# Patient Record
Sex: Female | Born: 1975 | Race: White | Hispanic: No | Marital: Married | Smoking: Never smoker
Health system: Southern US, Community
[De-identification: ages and names within clinical notes are randomized; demographics above are authoritative.]

## PROBLEM LIST (undated history)

## (undated) ENCOUNTER — Emergency Department: Admission: EM | Discharge status: Absent without leave | Payer: Managed Care, Other (non HMO)

## (undated) DIAGNOSIS — L509 Urticaria, unspecified: Secondary | ICD-10-CM

## (undated) DIAGNOSIS — I10 Essential (primary) hypertension: Secondary | ICD-10-CM

## (undated) DIAGNOSIS — F329 Major depressive disorder, single episode, unspecified: Secondary | ICD-10-CM

## (undated) DIAGNOSIS — F32A Depression, unspecified: Secondary | ICD-10-CM

## (undated) DIAGNOSIS — G43909 Migraine, unspecified, not intractable, without status migrainosus: Secondary | ICD-10-CM

## (undated) DIAGNOSIS — E039 Hypothyroidism, unspecified: Secondary | ICD-10-CM

## (undated) DIAGNOSIS — Z1371 Encounter for nonprocreative screening for genetic disease carrier status: Secondary | ICD-10-CM

## (undated) HISTORY — DX: Migraine, unspecified, not intractable, without status migrainosus: G43.909

## (undated) HISTORY — DX: Essential (primary) hypertension: I10

## (undated) HISTORY — DX: Hypothyroidism, unspecified: E03.9

## (undated) HISTORY — DX: Depression, unspecified: F32.A

## (undated) HISTORY — DX: Urticaria, unspecified: L50.9

## (undated) HISTORY — DX: Major depressive disorder, single episode, unspecified: F32.9

---
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
## 2000-05-04 HISTORY — PX: MASTOIDECTOMY: SHX711

## 2004-11-05 DIAGNOSIS — L739 Follicular disorder, unspecified: Secondary | ICD-10-CM | POA: Insufficient documentation

## 2006-09-01 ENCOUNTER — Ambulatory Visit: Payer: Self-pay | Admitting: Oncology

## 2007-09-24 ENCOUNTER — Inpatient Hospital Stay (HOSPITAL_COMMUNITY): Admission: AD | Admit: 2007-09-24 | Discharge: 2007-09-24 | Payer: Self-pay | Admitting: Obstetrics and Gynecology

## 2007-12-13 ENCOUNTER — Inpatient Hospital Stay (HOSPITAL_COMMUNITY): Admission: AD | Admit: 2007-12-13 | Discharge: 2007-12-16 | Payer: Self-pay | Admitting: Obstetrics and Gynecology

## 2007-12-13 ENCOUNTER — Inpatient Hospital Stay (HOSPITAL_COMMUNITY): Admission: AD | Admit: 2007-12-13 | Discharge: 2007-12-13 | Payer: Self-pay | Admitting: Obstetrics and Gynecology

## 2009-07-04 ENCOUNTER — Inpatient Hospital Stay (HOSPITAL_COMMUNITY): Admission: AD | Admit: 2009-07-04 | Discharge: 2009-07-06 | Payer: Self-pay | Admitting: Obstetrics and Gynecology

## 2010-07-27 LAB — CBC
HCT: 29.1 % — ABNORMAL LOW (ref 36.0–46.0)
MCHC: 34 g/dL (ref 30.0–36.0)
MCV: 93.9 fL (ref 78.0–100.0)
Platelets: 121 10*3/uL — ABNORMAL LOW (ref 150–400)
Platelets: 122 10*3/uL — ABNORMAL LOW (ref 150–400)
RBC: 3.98 MIL/uL (ref 3.87–5.11)
WBC: 10.8 10*3/uL — ABNORMAL HIGH (ref 4.0–10.5)
WBC: 12.5 10*3/uL — ABNORMAL HIGH (ref 4.0–10.5)

## 2010-07-27 LAB — RPR: RPR Ser Ql: NONREACTIVE

## 2010-07-27 LAB — COMPREHENSIVE METABOLIC PANEL
ALT: 10 U/L (ref 0–35)
AST: 16 U/L (ref 0–37)
Albumin: 2.9 g/dL — ABNORMAL LOW (ref 3.5–5.2)
CO2: 22 mEq/L (ref 19–32)
Chloride: 106 mEq/L (ref 96–112)
GFR calc Af Amer: >60 mL/min (ref >60)
GFR calc non Af Amer: >60 mL/min (ref >60)
Potassium: 3.7 mEq/L (ref 3.5–5.1)
Sodium: 135 mEq/L (ref 135–145)
Total Bilirubin: 0.8 mg/dL (ref 0.3–1.2)

## 2011-01-30 LAB — COMPREHENSIVE METABOLIC PANEL
ALT: 15
Albumin: 2.9 — ABNORMAL LOW
Albumin: 2.9 — ABNORMAL LOW
Alkaline Phosphatase: 254 — ABNORMAL HIGH
BUN: 3 — ABNORMAL LOW
Calcium: 8.8
Calcium: 8.9
GFR calc Af Amer: >60
Glucose, Bld: 85
Potassium: 3.4 — ABNORMAL LOW
Potassium: 4.1
Sodium: 136
Total Protein: 6.2
Total Protein: 6.4

## 2011-01-30 LAB — CBC
HCT: 31.1 — ABNORMAL LOW
HCT: 38.7
HCT: 39.4
HCT: 40.2
Hemoglobin: 10.4 — ABNORMAL LOW
Hemoglobin: 10.6 — ABNORMAL LOW
Hemoglobin: 13.3
Hemoglobin: 13.3
Hemoglobin: 13.4
MCHC: 33.5
MCHC: 33.9
MCHC: 34.1
MCHC: 34.2
MCV: 93.1
MCV: 94.1
Platelets: 125 — ABNORMAL LOW
RBC: 3.37 — ABNORMAL LOW
RBC: 4.24
RBC: 4.32
RDW: 13.2
RDW: 13.3
RDW: 13.3
WBC: 11.1 — ABNORMAL HIGH
WBC: 11.2 — ABNORMAL HIGH

## 2011-01-30 LAB — URIC ACID
Uric Acid, Serum: 5
Uric Acid, Serum: 5.2

## 2011-01-30 LAB — LACTATE DEHYDROGENASE: LDH: 120

## 2011-02-11 ENCOUNTER — Ambulatory Visit (INDEPENDENT_AMBULATORY_CARE_PROVIDER_SITE_OTHER): Payer: Self-pay | Admitting: Surgery

## 2011-02-16 ENCOUNTER — Ambulatory Visit (INDEPENDENT_AMBULATORY_CARE_PROVIDER_SITE_OTHER): Payer: BC Managed Care – PPO | Admitting: Surgery

## 2011-02-16 ENCOUNTER — Encounter (INDEPENDENT_AMBULATORY_CARE_PROVIDER_SITE_OTHER): Payer: Self-pay | Admitting: Surgery

## 2011-02-16 VITALS — BP 122/88 | HR 64 | Temp 99.2°F | Resp 20 | Ht 66.0 in | Wt 149.1 lb

## 2011-02-16 DIAGNOSIS — K5909 Other constipation: Secondary | ICD-10-CM

## 2011-02-16 DIAGNOSIS — K602 Anal fissure, unspecified: Secondary | ICD-10-CM

## 2011-02-16 DIAGNOSIS — K5900 Constipation, unspecified: Secondary | ICD-10-CM

## 2011-02-16 MED ORDER — AMBULATORY NON FORMULARY MEDICATION: 1.0000 "application " | Freq: Three times a day (TID) | Status: DC

## 2011-02-16 NOTE — Progress Notes (Signed)
Subjective:     Patient ID: Krista Baker, female   DOB: 07/11/1975, 35 y.o.   MRN: [id number]  HPI  Patient Care Team: Juluis Mire as Consulting Physician (Obstetrics and Gynecology)  This patient is a 35 y.o.female who presents today for surgical evaluation.   Reason for visit: Anal pain and bleeding. Probable hemorrhoids.  Patient has had with anal pain and bleeding since she had her pregnancies. Her children are now  age 55 years & 69 months old.  She notes that she will get a sharp anal pain with bowel movements. Usually not so much burning or irritation. She will note some blood with a bowel movement into the toilet.  No clots.   She has been using topical agents intermittently for the past 2 years. She does not think they have helped much. She occasionally will use Tucks pads.  She claims she usually has a bowel movement a day. However in however it is often hard and firm. No bad bouts of diarrhea. Worse constipation when she was pregnant. No history of hemorrhoidal flares. No prior interventions. No history of fistulae. She has a sister with Crohn's disease. Her maternal grandfather had a colon cancer diagnosed in in his advanced age no history of irritable bowel syndrome.  She's never had an endoscopy.   He brought this anal pain up to her obstetrician. Dr. Arelia Sneddon thought she may benefit from evaluation for possible hemorrhoidal treatment. Therefore, the patient was sent to [place] for surgical evaluation.  Past Medical History  Diagnosis Date  . Hypertension   . Rectal pain   . Rectal bleeding   . Blood in stool     Past Surgical History  Procedure Date  . Mastoidectomy 2002  . Cervix surgery 1998    laser surgery to remove abnormalities     History   Social History  . Marital Status: Married    Spouse Name: N/A    Number of Children: N/A  . Years of Education: N/A   Occupational History  . Not on file.   Social History Main Topics  . Smoking status: Never Smoker     . Smokeless tobacco: Never Used  . Alcohol Use: Yes  . Drug Use: No  . Sexually Active:    Other Topics Concern  . Not on file   Social History Narrative  . No narrative on file    Family History  Problem Relation Age of Onset  . Crohn's disease Sister   . Cancer Maternal Grandfather 56    colon     Current outpatient prescriptions:SUMAtriptan Succinate (IMITREX PO), Take by mouth as needed.  , Disp: , Rfl: ;  AMBULATORY NON FORMULARY MEDICATION, Place 1 application rectally 4 (four) times daily - after meals and at bedtime. Medication Name: Diltiazem 2%, Disp: 15 g, Rfl: 3  No Known Allergies     Review of Systems  Constitutional: Negative for fever, chills, diaphoresis, appetite change and fatigue.  HENT: Negative for ear pain, sore throat, trouble swallowing, neck pain and ear discharge.   Eyes: Negative for photophobia, discharge and visual disturbance.  Respiratory: Negative for cough, choking, chest tightness and shortness of breath.   Cardiovascular: Negative for chest pain and palpitations.  Gastrointestinal: Positive for anal bleeding and rectal pain. Negative for nausea, vomiting, abdominal pain, diarrhea, blood in stool and abdominal distention.  Genitourinary: Negative for dysuria, frequency, hematuria, flank pain and difficulty urinating.  Musculoskeletal: Negative for myalgias and gait problem.  Skin: Negative for  color change, pallor and rash.  Neurological: Negative for dizziness, speech difficulty, weakness and numbness.  Hematological: Negative for adenopathy.  Psychiatric/Behavioral: Negative for confusion and agitation. The patient is not nervous/anxious.        Objective:   Physical Exam  Constitutional: She is oriented to person, place, and time. She appears well-developed and well-nourished. No distress.  HENT:  Head: Normocephalic.  Mouth/Throat: Oropharynx is clear and moist. No oropharyngeal exudate.  Eyes: Conjunctivae and EOM are normal.  Pupils are equal, round, and reactive to light. No scleral icterus.  Neck: Normal range of motion. Neck supple. No tracheal deviation present.  Cardiovascular: Normal rate, regular rhythm and intact distal pulses.   Pulmonary/Chest: Effort normal and breath sounds normal. No respiratory distress. She exhibits no tenderness.  Abdominal: Soft. She exhibits no distension and no mass. There is no tenderness. There is no rebound and no guarding. Hernia confirmed negative in the right inguinal area and confirmed negative in the left inguinal area.  Genitourinary: Vagina normal. No vaginal discharge found.       Perianal skin clean with good hygiene.  No pruritis.  Normal sphincter tone.  No abscess/fistula.  No pilonidal disease.  Small L lat anal skin fold.  Mild old scar post midline & small anal fissure.  Mild TTP.  Tolerates digital and anoscopic rectal exam.  No rectal masses.  Hemorrhoidal piles WNL except very mild irritation R post pile at anus   Musculoskeletal: Normal range of motion. She exhibits no tenderness.  Lymphadenopathy:    She has no cervical adenopathy.       Right: No inguinal adenopathy present.       Left: No inguinal adenopathy present.  Neurological: She is alert and oriented to person, place, and time. No cranial nerve deficit. She exhibits normal muscle tone. Coordination normal.  Skin: Skin is warm and dry. No rash noted. She is not diaphoretic. No erythema.  Psychiatric: She has a normal mood and affect. Her behavior is normal. Judgment and thought content normal.       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 No strong evidence of hemorrhoidal problems    Plan:     The anatomy & physiology of the anorectal region was discussed.  The pathophysiology of anal fissure and differential diagnosis was discussed.  Natural history progression  was discussed.   I stressed the importance of a bowel regimen to have daily soft bowel  movements to minimize progression of disease.   I discussed the use of warm soaks &  muscle relaxant, diltiazem, to help the anal sphincter relax, allow the spasming to stop, and help the tear/fissure to heal.  If non-operative treatment does not heal the fissure, I would recommend examination under anesthesia for better examination to confirm the diagnosis and treat by lateral internal sphincterotomy to allow the fissure to heal.  Technique, benefits, alternatives discussed.  Risks such as bleeding, pain, incontinence, recurrence, heart attack, death, and other risks were discussed.   I doubt that it will come to that  Educational handouts further explaining the pathology, treatment options, and bowel regimen were given as well.  I offered to see the patient in 1 month vs PRN calling me if things do not improve.  The patient expressed understanding & will follow up PRN.  If the pain does not resolve in a few weeks or worsens, we may need to proceed with surgery.  She was worried with her family history that  she may need a colonoscopy. I noted that if the bleeding persists after the fissure is healed it may be reasonable to consider that. She feels reassured.  I did not see any significant hemorrhoid problems that require any bending or injection or surgery.  I stressed to her getting her bowel softer will help prevent further problems.

## 2011-02-16 NOTE — Patient Instructions (Signed)
Anal Fissure An anal fissure often begins with sharp pain. This is usually following a bowel movement. It often causes bright red blood stained stools. It is the most common cause of rectal bleeding. One common cause of this is passage of a large, hard stool. It can also be caused by having frequent diarrheal stools. Anal fissures that occur for a longtime (chronic) may require surgery. CAUSES  Passing large, hard stools.   Frequent diarrheal stools.   Constipation.  SYMPTOMS  Bright red, blood stained stools.   Rectal bleeding.  HOME CARE INSTRUCTIONS  If constipation is the cause of the rectal fissure, it may be necessary to add a bulk-forming laxative. A diet high in fruits, whole grains, and vegetables will also help.   Taking hot sitz baths for 1 half hour 4 times per day may help.   Increase your fluid intake.   Only take over-the-counter or prescription medicines for pain, discomfort, or fever as directed by your caregiver. Do not take aspirin as this may increase the tendency for bleeding.   Do not use ointments containing anesthetic medications or hydrocortisone. They could slow healing. Use the diltiazem cream to help heal the fissure  Avoid constipating foods such as bananas and cheese.   GETTING TO GOOD BOWEL HEALTH. Irregular bowel habits such as constipation and diarrhea can lead to many problems over time.  Having one soft bowel movement a day is the most important way to prevent further problems.  The anorectal canal is designed to handle stretching and feces to safely manage our ability to get rid of solid waste (feces, poop, stool) out of our body.  BUT, hard constipated stools can act like ripping concrete bricks and diarrhea can be a burning fire to this very sensitive area of our body, causing inflamed hemorrhoids, anal fissures, increasing risk is perirectal abscesses, abdominal pain/bloating, an making irritable bowel worse.     The goal: ONE SOFT BOWEL MOVEMENT A  DAY!  To have soft, regular bowel movements:    Drink at least 8 tall glasses of water a day.     Take plenty of fiber.  Fiber is the undigested part of plant food that passes into the colon, acting s "natures broom" to encourage bowel motility and movement.  Fiber can absorb and hold large amounts of water. This results in a larger, bulkier stool, which is soft and easier to pass. Work gradually over several weeks up to 6 servings a day of fiber (25g a day even more if needed) in the form of: o Vegetables -- Root (potatoes, carrots, turnips), leafy green (lettuce, salad greens, celery, spinach), or cooked high residue (cabbage, broccoli, etc) o Fruit -- Fresh (unpeeled skin & pulp), Dried (prunes, apricots, cherries, etc ),  or stewed ( applesauce)  o Whole grain breads, pasta, etc (whole wheat)  o Bran cereals    Bulking Agents -- This type of water-retaining fiber generally is easily obtained each day by one of the following:  o Psyllium bran -- The psyllium plant is remarkable because its ground seeds can retain so much water. This product is available as Metamucil, Konsyl, Effersyllium, Per Diem Fiber, or the less expensive generic preparation in drug and health food stores. Although labeled a laxative, it really is not a laxative.  o Methylcellulose -- This is another fiber derived from wood which also retains water. It is available as Citrucel. o Polyethylene Glycol - and "artificial" fiber commonly called Miralax or Glycolax.  It is helpful  for people with gassy or bloated feelings with regular fiber o Flax Seed - a less gassy fiber than psyllium   No reading or other relaxing activity while on the toilet. If bowel movements take longer than 5 minutes, you are too constipated   AVOID CONSTIPATION.  High fiber and water intake usually takes care of this.  Sometimes a laxative is needed to stimulate more frequent bowel movements, but    Laxatives are not a good long-term solution as it can wear  the colon out. o Osmotics (Milk of Magnesia, Fleets phosphosoda, Magnesium citrate, MiraLax, GoLytely) are safer than  o Stimulants (Senokot, Castor Oil, Dulcolax, Ex Lax)    o Do not take laxatives for more than 7days in a row.    IF SEVERELY CONSTIPATED, try a Bowel Retraining Program: o Do not use laxatives.  o Eat a diet high in roughage, such as bran cereals and leafy vegetables.  o Drink six (6) ounces of prune or apricot juice each morning.  o Eat two (2) large servings of stewed fruit each day.  o Take one (1) heaping tablespoon of a psyllium-based bulking agent twice a day. Use sugar-free sweetener when possible to avoid excessive calories.  o Eat a normal breakfast.  o Set aside 15 minutes after breakfast to sit on the toilet, but do not strain to have a bowel movement.  o If you do not have a bowel movement by the third day, use an enema and repeat the above steps.    Controlling diarrhea o Switch to liquids and simpler foods for a few days to avoid stressing your intestines further. o Avoid dairy products (especially milk & ice cream) for a short time.  The intestines often can lose the ability to digest lactose when stressed. o Avoid foods that cause gassiness or bloating.  Typical foods include beans and other legumes, cabbage, broccoli, and dairy foods.  Every person has some sensitivity to other foods, so listen to our body and avoid those foods that trigger problems for you. o Adding fiber (Citrucel, Metamucil, psyllium, Miralax) gradually can help thicken stools by absorbing excess fluid and retrain the intestines to act more normally.  Slowly increase the dose over a few weeks.  Too much fiber too soon can backfire and cause cramping & bloating. o Probiotics (such as active yogurt, Align, etc) may help repopulate the intestines and colon with normal bacteria and calm down a sensitive digestive tract.  Most studies show it to be of mild help, though, and such products can be  costly. o Medicines:   Bismuth subsalicylate (ex. Kayopectate, Pepto Bismol) every 30 minutes for up to 6 doses can help control diarrhea.  Avoid if pregnant.   Loperamide (Immodium) can slow down diarrhea.  Start with two tablets (4mg  total) first and then try one tablet every 6 hours.  Avoid if you are having fevers or severe pain.  If you are not better or start feeling worse, stop all medicines and call your doctor for advice o Call your doctor if you are getting worse or not better.  Sometimes further testing (cultures, endoscopy, X-ray studies, bloodwork, etc) may be needed to help diagnose and treat the cause of the diarrhea. o   In children, brown Karo syrup may be used by adding 1 teaspoon of syrup to 8 ounces of formula. An alternative is to give 3 teaspoons of mineral oil every day.  SEEK MEDICAL CARE IF: Rectal bleeding continues, changes in intensity, or becomes  more severe. MAKE SURE YOU:   Understand these instructions.   Will watch your condition.   Will get help right away if you are not doing well or get worse.  Document Released: 04/20/2005 Document Re-Released: 07/15/2009 [facility] Patient Information 2011 [place], [place].

## 2013-07-27 ENCOUNTER — Other Ambulatory Visit: Payer: Self-pay | Admitting: Obstetrics and Gynecology

## 2013-07-27 DIAGNOSIS — Z803 Family history of malignant neoplasm of breast: Secondary | ICD-10-CM

## 2013-11-02 ENCOUNTER — Encounter: Payer: Self-pay | Admitting: Neurology

## 2013-11-02 ENCOUNTER — Ambulatory Visit (INDEPENDENT_AMBULATORY_CARE_PROVIDER_SITE_OTHER): Payer: Managed Care, Other (non HMO) | Admitting: Neurology

## 2013-11-02 VITALS — BP 112/70 | HR 53 | Ht 67.0 in | Wt 167.0 lb

## 2013-11-02 DIAGNOSIS — F329 Major depressive disorder, single episode, unspecified: Secondary | ICD-10-CM | POA: Insufficient documentation

## 2013-11-02 DIAGNOSIS — G43909 Migraine, unspecified, not intractable, without status migrainosus: Secondary | ICD-10-CM

## 2013-11-02 DIAGNOSIS — F339 Major depressive disorder, recurrent, unspecified: Secondary | ICD-10-CM | POA: Insufficient documentation

## 2013-11-02 DIAGNOSIS — F32A Depression, unspecified: Secondary | ICD-10-CM | POA: Insufficient documentation

## 2013-11-02 DIAGNOSIS — R519 Headache, unspecified: Secondary | ICD-10-CM | POA: Insufficient documentation

## 2013-11-02 DIAGNOSIS — R51 Headache: Secondary | ICD-10-CM

## 2013-11-02 MED ORDER — ZOLMITRIPTAN 2.5 MG NA SOLN: NASAL | Status: DC

## 2013-11-02 NOTE — Progress Notes (Signed)
NEUROLOGY CONSULTATION NOTE  Krista Baker MRN: [id number] DOB: 08-Mar-1976  Referring provider: Orvilla CornwallKeavie Hairfield, FNP Primary care provider: Dr. Doreen Beamhruv Vyas  Reason for consult:  Worsening migraines  Dear Dr Sherril CroonVyas:  Thank you for your kind referral of Krista Baker for consultation of the above symptoms. Although her history is well known to you, please allow me to reiterate it for the purpose of our medical record. Records and images were personally reviewed where available.  HISTORY OF PRESENT ILLNESS: This is a pleasant 38 year old right-handed woman with a history of depression, hypothyroidism, and headaches since the 8th grade. She reports migraines started around 10 years abut but over the past year has been getting worse.  Headaches always start in the left retro-orbital region with associated tearing, then slowly radiating diffusely, and down her neck, where she feels like she was hit on the head with an axe.  Her eyes would become red, with associated blurred vision, photophobia, and phonophobia.  Headaches increase around the time of her menstrual period, otherwise no other identified triggers.  She usually has 3 headache a month, however recently started having vomiting which is new for her. She was awoken at 2am with severe headache that lasted until noon the next day, she had to sit in a tub of cold water for some relief.  In the past, Imitrex would help, however recently this and a trial of Relpax would not offer relief.  Excedrin migraine does not help.  She would usually lie in bed in the dark.  She usually gets 6 hours of sleep and still feels tired in the morning. She does snore but denies any apneic episodes.  She denies any visual aura, no focal numbness/tingling/weakness. No dizziness, diplopia, dysarthria, dysphagia, back pain, bowel/bladder dysfunction. There is a history of migraines in her brother and maternal aunt.  She used to take Lexapro for depression but gained  40 lbs on this, and has been switched to Effexor 6 weeks ago, recently increased to 37.5mg  BID.  She has since lost 10 lbs with the switch.  She started Mirena IUD last March.  Laboratory Data: CBC, CMP, TSH from 08/28/2013 were normal.  PAST MEDICAL HISTORY: Past Medical History  Diagnosis Date  . Hypothyroid   . Depression   . Migraine     PAST SURGICAL HISTORY: Past Surgical History  Procedure Laterality Date  . Mastoidectomy  2002  . Cervix surgery  1998    laser surgery to remove abnormalities     MEDICATIONS: No current outpatient prescriptions on file prior to visit.   No current facility-administered medications on file prior to visit.    ALLERGIES: No Known Allergies  FAMILY HISTORY: Family History  Problem Relation Age of Onset  . Crohn's disease Sister   . Cancer Maternal Grandfather [id number]    colon     SOCIAL HISTORY: History   Social History  . Marital Status: Married    Spouse Name: N/A    Number of Children: N/A  . Years of Education: N/A   Occupational History  . Not on file.   Social History Main Topics  . Smoking status: Never Smoker   . Smokeless tobacco: Never Used  . Alcohol Use: Yes  . Drug Use: No  . Sexual Activity:    Other Topics Concern  . Not on file   Social History Narrative  . No narrative on file    REVIEW OF SYSTEMS: Constitutional: No fevers, chills, or sweats, no  generalized fatigue, change in appetite Eyes: No visual changes, double vision, eye pain Ear, nose and throat: No hearing loss, ear pain, nasal congestion, sore throat Cardiovascular: No chest pain, palpitations Respiratory:  No shortness of breath at rest or with exertion, wheezes GastrointestinaI: No nausea, vomiting, diarrhea, abdominal pain, fecal incontinence Genitourinary:  No dysuria, urinary retention or frequency Musculoskeletal:  + neck pain, no back pain Integumentary: No rash, pruritus, skin lesions Neurological: as above Psychiatric:  +depression, no insomnia, anxiety Endocrine: No palpitations, fatigue, diaphoresis, mood swings, change in appetite, change in weight, increased thirst Hematologic/Lymphatic:  No anemia, purpura, petechiae. Allergic/Immunologic: no itchy/runny eyes, nasal congestion, recent allergic reactions, rashes  PHYSICAL EXAM: Filed Vitals:   11/02/13 0751  BP: 112/70  Pulse: 53   General: No acute distress Head:  Normocephalic/atraumatic Eyes: Fundoscopic exam shows bilateral sharp discs, no vessel changes, exudates, or hemorrhages Neck: supple, no paraspinal tenderness, full range of motion Back: No paraspinal tenderness Heart: regular rate and rhythm Lungs: Clear to auscultation bilaterally. Vascular: No carotid bruits. Skin/Extremities: No rash, no edema Neurological Exam: Mental status: alert and oriented to person, place, and time, no dysarthria or aphasia, Fund of knowledge is appropriate.  Recent and remote memory are intact.  Attention and concentration are normal.    Able to name objects and repeat phrases. Cranial nerves: CN I: not tested CN II: pupils equal, round and reactive to light, visual fields intact, fundi unremarkable. CN III, IV, VI:  full range of motion, no nystagmus, no ptosis CN V: facial sensation intact CN VII: upper and lower face symmetric CN VIII: hearing intact to finger rub CN IX, X: gag intact, uvula midline CN XI: sternocleidomastoid and trapezius muscles intact CN XII: tongue midline Bulk & Tone: normal, no fasciculations. Motor: 5/5 throughout with no pronator drift. Sensation: intact to light touch, cold, pin, vibration and joint position sense.  No extinction to double simultaneous stimulation.  Romberg test negative Deep Tendon Reflexes: +2 throughout, no ankle clonus Plantar responses: downgoing bilaterally Cerebellar: no incoordination on finger to nose, heel to shin. No dysdiadochokinesia Gait: narrow-based and steady, able to tandem walk  adequately. Tremor: none  IMPRESSION: This is a pleasant 38 year old right-handed woman with a history of depression, hypothyroidism, and migraines since childhood, presenting with a change in the quality and severity of her headaches, now with associated vomiting and early morning awakening, unrelieved by triptans that used to help in the past. She has never had any brain imaging in the past, brain MRI without contrast will be ordered to assess for underlying structural abnormality.  If brain is normal, considerations include migraines versus cluster headaches. We discussed treatment with prophylactic and rescue medications.  With the increased severity of headaches, she would benefit from a daily headache preventative medication.  Effexor has been found to be effective for headache prophylaxis as well, she will continue to monitor headache frequency with recently started medication.  If headaches continue, Verapamil will be started.  For rescue, she has tried Imitrex and Relpax with no effect, she will be given a prescription for Zomig nasal spray. Imitrex [place] is another consideration.  Side effects were discussed. She will keep a headache calendar and follow-up in 3 months.  Thank you for allowing me to participate in the care of this patient. Please do not hesitate to call for any questions or concerns.   Patrcia DollyKaren Darragh Nay, M.D.  CC: Dr. Sherril CroonVyas

## 2013-11-02 NOTE — Patient Instructions (Addendum)
1. MRI brain without contrast-July 13 @10 :45 [facility] [phone] 2. Use Zomig nasal spray, spray in one nostril at onset of headache, may use second dose after 2 hours. Do not use more than 3 times a week 3. Keep a headache diary 4. Follow-up in 3 months

## 2013-12-07 ENCOUNTER — Telehealth: Payer: Self-pay | Admitting: *Deleted

## 2013-12-07 NOTE — Telephone Encounter (Signed)
Patient notified of MRI results left message on machine

## 2013-12-07 NOTE — Telephone Encounter (Signed)
Message copied by Samantha CrimesALLEN, Tema Alire C on Thu Dec 07, 2013  9:06 AM ------      Message from: Van ClinesAQUINO, KAREN M      Created: Mon Nov 27, 2013  4:55 PM      Regarding: pls do Telephone encounter       Pls do phone encounter to document that MRI brain is normal, no evidence of tumor, stroke, or bleed, thanks!! ------

## 2014-02-02 ENCOUNTER — Ambulatory Visit (INDEPENDENT_AMBULATORY_CARE_PROVIDER_SITE_OTHER): Payer: Managed Care, Other (non HMO) | Admitting: Neurology

## 2014-02-02 ENCOUNTER — Encounter: Payer: Self-pay | Admitting: Neurology

## 2014-02-02 VITALS — BP 120/84 | HR 62 | Resp 16 | Ht 67.0 in | Wt 179.0 lb

## 2014-02-02 DIAGNOSIS — G43009 Migraine without aura, not intractable, without status migrainosus: Secondary | ICD-10-CM

## 2014-02-02 DIAGNOSIS — G43909 Migraine, unspecified, not intractable, without status migrainosus: Secondary | ICD-10-CM | POA: Insufficient documentation

## 2014-02-02 MED ORDER — SUMATRIPTAN SUCCINATE 6 MG/0.5ML ~~LOC~~ SOAJ: SUBCUTANEOUS | Status: DC

## 2014-02-02 NOTE — Progress Notes (Signed)
Done

## 2014-02-02 NOTE — Patient Instructions (Signed)
1. Continue all your medications 2. Use Imitrex injection at onset of headache, may take second dose after 1 hour if needed. Do not use more than 3 a week 3. Call our office if ineffective and we will call in [place] 4. Keep a headache calendar and follow-up in 3 months

## 2014-02-02 NOTE — Progress Notes (Signed)
NEUROLOGY FOLLOW UP OFFICE NOTE  RHEAGAN NAYAK [id number]  HISTORY OF PRESENT ILLNESS: I had the pleasure of seeing Krista Baker in follow-up in the neurology clinic on 02/02/2014.  The patient was last seen 3 months ago for worsening headaches that increased in frequency up to 5 times a month with vomiting. She had started Effexor a few weeks prior to her initial visit, we discussed that this is used for headache prophylaxis and she is currently on Effexor XR 75mg /day. MRI brain without contrast normal.  She reports that the headaches have decreased in frequency, she only had 3 in the past 3 months, last migraine was 1 month ago, but Zomig nasal spray did not help. Headaches still lasted 8 hours, with associated nausea and photophobia.  She feels there may be a relation to her menstrual cycle, she is on Mirena with no period.  She reports sleep is good, she gets refreshing sleep.  HPI:  This is a pleasant 38 yo RH woman with a history of depression, hypothyroidism, and headaches since the 8th grade. She reports migraines started around 10 years abut but over the past year has been getting worse. Headaches always start in the left retro-orbital region with associated tearing, then slowly radiating diffusely, and down her neck, where she feels like she was hit on the head with an axe. Her eyes would become red, with associated blurred vision, photophobia, and phonophobia. Headaches increase around the time of her menstrual period, otherwise no other identified triggers. She usually has 3 headache a month, however recently started having vomiting which is new for her. She was awoken at 2am with severe headache that lasted until noon the next day, she had to sit in a tub of cold water for some relief. In the past, Imitrex would help, however recently this and a trial of Relpax would not offer relief. Excedrin migraine does not help. She would usually lie in bed in the dark. She usually gets 6 hours of  sleep and still feels tired in the morning. She does snore but denies any apneic episodes.  There is a history of migraines in her brother and maternal aunt.   PAST MEDICAL HISTORY: Past Medical History  Diagnosis Date  . Hypothyroid   . Depression   . Migraine     MEDICATIONS: Current Outpatient Prescriptions on File Prior to Visit  Medication Sig Dispense Refill  . levonorgestrel (MIRENA) 20 MCG/24HR IUD 1 each by Intrauterine route once.      Marland Kitchen levothyroxine (SYNTHROID, LEVOTHROID) 75 MCG tablet Take 75 mcg by mouth daily before breakfast.      . ZOLMitriptan 2.5 MG SOLN Spray in one nostril at onset of headache, may use second dose after 2 hours. Do not use more than 3 times a week.  6 each  3   No current facility-administered medications on file prior to visit.    ALLERGIES: No Known Allergies  FAMILY HISTORY: Family History  Problem Relation Age of Onset  . Crohn's disease Sister   . Cancer Maternal Grandfather 35    colon     SOCIAL HISTORY: History   Social History  . Marital Status: Married    Spouse Name: N/A    Number of Children: N/A  . Years of Education: N/A   Occupational History  . Not on file.   Social History Main Topics  . Smoking status: Never Smoker   . Smokeless tobacco: Never Used  . Alcohol Use: Yes  . Drug  Use: No  . Sexual Activity:    Other Topics Concern  . Not on file   Social History Narrative  . No narrative on file    REVIEW OF SYSTEMS: Constitutional: No fevers, chills, or sweats, no generalized fatigue, change in appetite Eyes: No visual changes, double vision, eye pain Ear, nose and throat: No hearing loss, ear pain, nasal congestion, sore throat Cardiovascular: No chest pain, palpitations Respiratory:  No shortness of breath at rest or with exertion, wheezes GastrointestinaI: No nausea, vomiting, diarrhea, abdominal pain, fecal incontinence Genitourinary:  No dysuria, urinary retention or frequency Musculoskeletal:   No neck pain, back pain Integumentary: No rash, pruritus, skin lesions Neurological: as above Psychiatric: No depression, insomnia, anxiety Endocrine: No palpitations, fatigue, diaphoresis, mood swings, change in appetite, change in weight, increased thirst Hematologic/Lymphatic:  No anemia, purpura, petechiae. Allergic/Immunologic: no itchy/runny eyes, nasal congestion, recent allergic reactions, rashes  PHYSICAL EXAM: Filed Vitals:   02/02/14 0959  BP: 120/84  Pulse: 62  Resp: 16   General: No acute distress Head:  Normocephalic/atraumatic Neck: supple, no paraspinal tenderness, full range of motion Heart:  Regular rate and rhythm Lungs:  Clear to auscultation bilaterally Back: No paraspinal tenderness Skin/Extremities: No rash, no edema Neurological Exam: alert and oriented to person, place, and time. No aphasia or dysarthria. Fund of knowledge is appropriate.  Recent and remote memory are intact.  Attention and concentration are normal.    Able to name objects and repeat phrases. Cranial nerves: Pupils equal, round, reactive to light.  Fundoscopic exam unremarkable, no papilledema. Extraocular movements intact with no nystagmus. Visual fields full. Facial sensation intact. No facial asymmetry. Tongue, uvula, palate midline.  Motor: Bulk and tone normal, muscle strength 5/5 throughout with no pronator drift.  Sensation to light touch.  No extinction to double simultaneous stimulation.  Deep tendon reflexes 2+ throughout, toes downgoing.  Finger to nose testing intact.  Gait narrow-based and steady, able to tandem walk adequately.  Romberg negative.  IMPRESSION: This is a pleasant 38 yo RH woman with a history of depression, hypothyroidism, and migraines since childhood, who presented with a change in the quality and severity of her headaches, now with associated vomiting and early morning awakening, unrelieved by triptans that used to help in the past. MRI brain normal. She had switched  to Effexor XR 75mg /day for depression, this also helps with headache prophylaxis, and she does note a reduction in headaches to only 3 in the past 3 months.  However, Zomig nasal spray did not help for rescue. She will try subcutaneous Imitrex for rescue, side effects were discussed. If ineffective, adjunct with Verlin FesterCambia will be done.  She will keep a headache calendar and follow-up in 3 months.  Thank you for allowing me to participate in her care.  Please do not hesitate to call for any questions or concerns.  The duration of this appointment visit was 25 minutes of face-to-face time with the patient.  Greater than 50% of this time was spent in counseling, explanation of diagnosis, planning of further management, and coordination of care.   Patrcia DollyKaren Ronald Londo, M.D.   CC: Dr. Sherril CroonVyas

## 2014-05-07 ENCOUNTER — Ambulatory Visit (INDEPENDENT_AMBULATORY_CARE_PROVIDER_SITE_OTHER): Payer: Managed Care, Other (non HMO) | Admitting: Neurology

## 2014-05-07 ENCOUNTER — Encounter: Payer: Self-pay | Admitting: Neurology

## 2014-05-07 VITALS — BP 124/86 | HR 64 | Resp 16 | Ht 67.0 in | Wt 190.0 lb

## 2014-05-07 DIAGNOSIS — G43009 Migraine without aura, not intractable, without status migrainosus: Secondary | ICD-10-CM

## 2014-05-07 NOTE — Progress Notes (Signed)
NEUROLOGY FOLLOW UP OFFICE NOTE  Krista Baker [id number]  HISTORY OF PRESENT ILLNESS: I had the pleasure of seeing Krista Baker in follow-up in the neurology clinic on 05/07/2014.  The patient was last seen 3 months ago for worsening migraines that increased in frequency. She had good response to Effexor with reduction in headache frequency. She continues to do well, with 3 migraines in the past 3 months. Last migraine was in November. She has had a good response to prn Imitrex [place], with headaches resolving within 15 minutes. She denies any dizziness, diplopia, focal numbness/tingling/weakness.   HPI: This is a pleasant 39 yo RH woman with a history of depression, hypothyroidism, and headaches since the 8th grade. She reports migraines started around 10 years abut but over the past year has been getting worse. Headaches always start in the left retro-orbital region with associated tearing, then slowly radiating diffusely, and down her neck, where she feels like she was hit on the head with an axe. Her eyes would become red, with associated blurred vision, photophobia, and phonophobia. Headaches increase around the time of her menstrual period, otherwise no other identified triggers. She usually has 3 headaches a month, however started having vomiting which is new for her. She was awoken at 2am with severe headache that lasted until noon the next day, she had to sit in a tub of cold water for some relief. In the past, Imitrex would help, however recently this and a trial of Relpax would not offer relief. Excedrin migraine does not help. She would usually lie in bed in the dark. She usually gets 6 hours of sleep and still feels tired in the morning. She does snore but denies any apneic episodes. There is a history of migraines in her brother and maternal aunt.   Diagnostic Data:MRI brain normal  PAST MEDICAL HISTORY: Past Medical History  Diagnosis Date  . Hypothyroid   . Depression   .  Migraine     MEDICATIONS: Current Outpatient Prescriptions on File Prior to Visit  Medication Sig Dispense Refill  . levonorgestrel (MIRENA) 20 MCG/24HR IUD 1 each by Intrauterine route once.    Marland Kitchen levothyroxine (SYNTHROID, LEVOTHROID) 75 MCG tablet Take 75 mcg by mouth daily before breakfast.    . SUMAtriptan 6 MG/0.5ML SOAJ Administer as instructed at onset of headache, may use second dose after 1 hour. Do not use more than 3 a week 9 Syringe 6  . venlafaxine XR (EFFEXOR-XR) 75 MG 24 hr capsule Take 75 mg by mouth daily with breakfast.     No current facility-administered medications on file prior to visit.    ALLERGIES: No Known Allergies  FAMILY HISTORY: Family History  Problem Relation Age of Onset  . Crohn's disease Sister   . Cancer Maternal Grandfather 16    colon     SOCIAL HISTORY: History   Social History  . Marital Status: Married    Spouse Name: N/A    Number of Children: N/A  . Years of Education: N/A   Occupational History  . Not on file.   Social History Main Topics  . Smoking status: Never Smoker   . Smokeless tobacco: Never Used  . Alcohol Use: Yes  . Drug Use: No  . Sexual Activity: Not on file   Other Topics Concern  . Not on file   Social History Narrative  . No narrative on file    REVIEW OF SYSTEMS: Constitutional: No fevers, chills, or sweats, no generalized fatigue, change  in appetite Eyes: No visual changes, double vision, eye pain Ear, nose and throat: No hearing loss, ear pain, nasal congestion, sore throat Cardiovascular: No chest pain, palpitations Respiratory:  No shortness of breath at rest or with exertion, wheezes GastrointestinaI: No nausea, vomiting, diarrhea, abdominal pain, fecal incontinence Genitourinary:  No dysuria, urinary retention or frequency Musculoskeletal:  No neck pain, back pain Integumentary: No rash, pruritus, skin lesions Neurological: as above Psychiatric: No depression, insomnia, anxiety Endocrine:  No palpitations, fatigue, diaphoresis, mood swings, change in appetite, change in weight, increased thirst Hematologic/Lymphatic:  No anemia, purpura, petechiae. Allergic/Immunologic: no itchy/runny eyes, nasal congestion, recent allergic reactions, rashes  PHYSICAL EXAM: Filed Vitals:   05/07/14 0907  BP: 124/86  Pulse: 64  Resp: 16   General: No acute distress Head:  Normocephalic/atraumatic Neck: supple, no paraspinal tenderness, full range of motion Heart:  Regular rate and rhythm Lungs:  Clear to auscultation bilaterally Back: No paraspinal tenderness Skin/Extremities: No rash, no edema Neurological Exam: alert and oriented to person, place, and time. No aphasia or dysarthria. Fund of knowledge is appropriate.  Recent and remote memory are intact.  Attention and concentration are normal.    Able to name objects and repeat phrases. Cranial nerves: Pupils equal, round, reactive to light.  Fundoscopic exam unremarkable, no papilledema. Extraocular movements intact with no nystagmus. Visual fields full. Facial sensation intact. No facial asymmetry. Tongue, uvula, palate midline.  Motor: Bulk and tone normal, muscle strength 5/5 throughout with no pronator drift.  Sensation to light touch intact.  No extinction to double simultaneous stimulation.  Deep tendon reflexes 2+ throughout, toes downgoing.  Finger to nose testing intact.  Gait narrow-based and steady, able to tandem walk adequately.  Romberg negative.  IMPRESSION: This is a pleasant 39 yo RH woman with a history of depression, hypothyroidism, and migraines since childhood, who presented with a change in the quality and severity of her headaches, with associated vomiting and early morning awakening, unrelieved by triptans that used to help in the past. MRI brain normal. She has had good response to Effexor XR /day for depression, this also helps with headache prophylaxis. She has had good response to prn Imitrex [place]. Continue current  regimen, she will continue to keep a headache diary and follow-up in 6 months.  Thank you for allowing me to participate in her care.  Please do not hesitate to call for any questions or concerns.  The duration of this appointment visit was 15 minutes of face-to-face time with the patient.  Greater than 50% of this time was spent in counseling, explanation of diagnosis, planning of further management, and coordination of care.   Patrcia Dolly, M.D.   CC: Dr. Sherril Croon

## 2014-05-07 NOTE — Patient Instructions (Signed)
1. Continue Effexor as prescribed 2. Take Imitrex injection as needed, do not use more than 3 times a week 3. Follow-up in 6 months

## 2014-05-07 NOTE — Progress Notes (Signed)
Done

## 2014-11-06 ENCOUNTER — Ambulatory Visit: Payer: Managed Care, Other (non HMO) | Admitting: Neurology

## 2014-12-10 ENCOUNTER — Ambulatory Visit: Payer: Managed Care, Other (non HMO) | Admitting: Neurology

## 2014-12-13 ENCOUNTER — Encounter: Payer: Self-pay | Admitting: *Deleted

## 2014-12-13 NOTE — Progress Notes (Signed)
No show letter sent for 12/10/2014

## 2017-03-10 ENCOUNTER — Other Ambulatory Visit: Payer: Self-pay | Admitting: Obstetrics and Gynecology

## 2017-03-10 DIAGNOSIS — Z803 Family history of malignant neoplasm of breast: Secondary | ICD-10-CM

## 2018-06-22 ENCOUNTER — Encounter (INDEPENDENT_AMBULATORY_CARE_PROVIDER_SITE_OTHER): Payer: Self-pay | Admitting: *Deleted

## 2018-06-22 ENCOUNTER — Encounter (INDEPENDENT_AMBULATORY_CARE_PROVIDER_SITE_OTHER): Payer: Self-pay | Admitting: Internal Medicine

## 2018-06-22 ENCOUNTER — Ambulatory Visit (INDEPENDENT_AMBULATORY_CARE_PROVIDER_SITE_OTHER): Payer: BLUE CROSS/BLUE SHIELD | Admitting: Internal Medicine

## 2018-06-22 VITALS — BP 105/67 | HR 71 | Temp 98.0°F | Ht 68.0 in | Wt 191.5 lb

## 2018-06-22 DIAGNOSIS — K760 Fatty (change of) liver, not elsewhere classified: Secondary | ICD-10-CM | POA: Diagnosis not present

## 2018-06-22 NOTE — Progress Notes (Signed)
   Subjective:    Patient ID: Krista Baker, female    DOB: [birth year], 43 y.o.   MRN: [id number]  HPI Here today for f/u after recent visit to [facility] (06/19/2018) for fatty liver.  She underwent a CT scan which showed no acute finding or explanation for the patient's symptoms. Progressive hepatic steatosis.  Her PCP [facility].  Orginally told she had a fatty liver 2019. No family of liver disease.  2/16/2-020 total bili 0.7, AST 14.6, ALT 15, ALP 42,  H and H 13.0 and 39.1, platelet ct 227.    Review of Systems     Past Medical History:  Diagnosis Date  . Depression   . Hypertension   . Hypothyroid   . Migraine     Past Surgical History:  Procedure Laterality Date  . CERVIX SURGERY  1998   laser surgery to remove abnormalities   . MASTOIDECTOMY  2002    No Known Allergies  Current Outpatient Medications on File Prior to Visit  Medication Sig Dispense Refill  . levothyroxine (SYNTHROID, LEVOTHROID) 88 MCG tablet Take 88 mcg by mouth daily before breakfast.    . lisinopril-hydrochlorothiazide (PRINZIDE,ZESTORETIC) 10-12.5 MG tablet Take 1 tablet by mouth daily.    . Norethin Ace-Eth Estrad-FE (BLISOVI FE 1.5/30 PO) Take by mouth.    . venlafaxine XR (EFFEXOR-XR) 75 MG 24 hr capsule Take 75 mg by mouth daily with breakfast.    . HYDROcodone-acetaminophen (NORCO/VICODIN) 5-325 MG tablet Take 1 tablet by mouth every 6 (six) hours as needed for moderate pain.     No current facility-administered medications on file prior to visit.      Objective:   Physical Exam Blood pressure 105/67, pulse 71, temperature 98 F (36.7 C), height 5\' 8"  (1.727 m), weight 191 lb 8 oz (86.9 kg). Alert and oriented. Skin warm and dry. Oral mucosa is moist.   . Sclera anicteric, conjunctivae is pink. Thyroid not enlarged. No cervical lymphadenopathy. Lungs clear. Heart regular rate and rhythm.  Abdomen is soft. Bowel sounds are positive. No hepatomegaly. No abdominal masses felt. No  tenderness.  No edema to lower extremities.        Assessment & Plan:  Fatty liver. Am going to get an US abdomen/elast. Diet and exercise. Hand out on NAFLD given to patient.

## 2018-06-22 NOTE — Patient Instructions (Addendum)
Nonalcoholic Fatty Liver Disease Diet Nonalcoholic fatty liver disease is a condition that causes fat to accumulate in and around the liver. The disease makes it harder for the liver to work the way that it should. Following a healthy diet can help to keep nonalcoholic fatty liver disease under control. It can also help to prevent or improve conditions that are associated with the disease, such as heart disease, diabetes, high blood pressure, and abnormal cholesterol levels. Along with regular exercise, this diet:  Promotes weight loss.  Helps to control blood sugar levels.  Helps to improve the way that the body uses insulin. What do I need to know about this diet?  Use the glycemic index (GI) to plan your meals. The index tells you how quickly a food will raise your blood sugar. Choose low-GI foods. These foods take a longer time to raise blood sugar.  Keep track of how many calories you take in. Eating the right amount of calories will help you to achieve a healthy weight.  You may want to follow a Mediterranean diet. This diet includes a lot of vegetables, lean meats or fish, whole grains, fruits, and healthy oils and fats. What foods can I eat? Grains Whole grains, such as whole-wheat or whole-grain breads, crackers, tortillas, cereals, and pasta. Stone-ground whole wheat. Pumpernickel bread. Unsweetened oatmeal. Bulgur. Barley. Quinoa. Brown or wild rice. Corn or whole-wheat flour tortillas. Vegetables Lettuce. Spinach. Peas. Beets. Cauliflower. Cabbage. Broccoli. Carrots. Tomatoes. Squash. Eggplant. Herbs. Peppers. Onions. Cucumbers. Brussels sprouts. Yams and sweet potatoes. Beans. Lentils. Fruits Bananas. Apples. Oranges. Grapes. Papaya. Mango. Pomegranate. Kiwi. Grapefruit. Cherries. Meats and Other Protein Sources Seafood and shellfish. Lean meats. Poultry. Tofu. Dairy Low-fat or fat-free dairy products, such as yogurt, cottage cheese, and cheese. Beverages Water. Sugar-free  drinks. Tea. Coffee. Low-fat or skim milk. Milk alternatives, such as soy or almond milk. Real fruit juice. Condiments Mustard. Relish. Low-fat, low-sugar ketchup and barbecue sauce. Low-fat or fat-free mayonnaise. Sweets and Desserts Sugar-free sweets. Fats and Oils Avocado. Canola or olive oil. Nuts and nut butters. Seeds. The items listed above may not be a complete list of recommended foods or beverages. Contact your dietitian for more options. What foods are not recommended? Palm oil and coconut oil. Processed foods. Fried foods. Sweetened drinks, such as sweet tea, milkshakes, snow cones, iced sweet drinks, and sodas. Alcohol. Sweets. Foods that contain a lot of salt or sodium. The items listed above may not be a complete list of foods and beverages to avoid. Contact your dietitian for more information. This information is not intended to replace advice given to you by your health care provider. Make sure you discuss any questions you have with your health care provider. Document Released: 09/04/2014 Document Revised: 09/26/2015 Document Reviewed: 05/15/2014 Elsevier Interactive Patient Education  2019 [employer].   Diet and exercise.

## 2018-06-24 ENCOUNTER — Ambulatory Visit (HOSPITAL_COMMUNITY)
Admission: RE | Admit: 2018-06-24 | Discharge: 2018-06-24 | Disposition: A | Discharge status: Home / Self Care | Payer: BLUE CROSS/BLUE SHIELD | Source: Ambulatory Visit | Attending: Internal Medicine | Admitting: Internal Medicine

## 2018-06-24 DIAGNOSIS — K760 Fatty (change of) liver, not elsewhere classified: Secondary | ICD-10-CM | POA: Diagnosis not present

## 2018-06-27 ENCOUNTER — Other Ambulatory Visit (INDEPENDENT_AMBULATORY_CARE_PROVIDER_SITE_OTHER): Payer: Self-pay | Admitting: *Deleted

## 2018-06-27 ENCOUNTER — Telehealth (INDEPENDENT_AMBULATORY_CARE_PROVIDER_SITE_OTHER): Payer: Self-pay | Admitting: Internal Medicine

## 2018-06-27 DIAGNOSIS — K760 Fatty (change of) liver, not elsewhere classified: Secondary | ICD-10-CM

## 2018-06-27 NOTE — Telephone Encounter (Signed)
Hepatic is noted for 6 months. A letter will be sent as a reminder to the patient.

## 2018-06-27 NOTE — Telephone Encounter (Signed)
Krista Baker, Hepatic in 6 months  [name], OV in 1 year

## 2018-11-28 ENCOUNTER — Other Ambulatory Visit (INDEPENDENT_AMBULATORY_CARE_PROVIDER_SITE_OTHER): Payer: Self-pay | Admitting: *Deleted

## 2018-11-28 DIAGNOSIS — K760 Fatty (change of) liver, not elsewhere classified: Secondary | ICD-10-CM

## 2018-12-30 LAB — HEPATIC FUNCTION PANEL
AG Ratio: 1.5 (calc) (ref 1.0–2.5)
ALT: 27 U/L (ref 6–29)
AST: 26 U/L (ref 10–30)
Albumin: 4.3 g/dL (ref 3.6–5.1)
Alkaline phosphatase (APISO): 31 U/L (ref 31–125)
Bilirubin, Direct: 0.1 mg/dL (ref 0.0–0.2)
Globulin: 2.8 g/dL (calc) (ref 1.9–3.7)
Indirect Bilirubin: 0.5 mg/dL (calc) (ref 0.2–1.2)
Total Bilirubin: 0.6 mg/dL (ref 0.2–1.2)
Total Protein: 7.1 g/dL (ref 6.1–8.1)

## 2019-04-26 ENCOUNTER — Other Ambulatory Visit: Payer: Self-pay | Admitting: Obstetrics and Gynecology

## 2019-04-26 DIAGNOSIS — R928 Other abnormal and inconclusive findings on diagnostic imaging of breast: Secondary | ICD-10-CM

## 2019-05-02 DIAGNOSIS — R87619 Unspecified abnormal cytological findings in specimens from cervix uteri: Secondary | ICD-10-CM | POA: Insufficient documentation

## 2019-05-02 DIAGNOSIS — E079 Disorder of thyroid, unspecified: Secondary | ICD-10-CM | POA: Insufficient documentation

## 2019-05-09 ENCOUNTER — Other Ambulatory Visit: Payer: Self-pay

## 2019-05-09 ENCOUNTER — Ambulatory Visit
Admission: RE | Admit: 2019-05-09 | Discharge: 2019-05-09 | Disposition: A | Discharge status: Home / Self Care | Payer: 59 | Source: Ambulatory Visit | Attending: Obstetrics and Gynecology | Admitting: Obstetrics and Gynecology

## 2019-05-09 ENCOUNTER — Ambulatory Visit
Admission: RE | Admit: 2019-05-09 | Discharge: 2019-05-09 | Disposition: A | Discharge status: Home / Self Care | Payer: BLUE CROSS/BLUE SHIELD | Source: Ambulatory Visit | Attending: Obstetrics and Gynecology | Admitting: Obstetrics and Gynecology

## 2019-05-09 DIAGNOSIS — R928 Other abnormal and inconclusive findings on diagnostic imaging of breast: Secondary | ICD-10-CM

## 2019-06-22 ENCOUNTER — Ambulatory Visit (INDEPENDENT_AMBULATORY_CARE_PROVIDER_SITE_OTHER): Payer: BLUE CROSS/BLUE SHIELD | Admitting: Gastroenterology

## 2019-06-26 ENCOUNTER — Other Ambulatory Visit: Payer: Self-pay

## 2019-06-26 ENCOUNTER — Encounter (INDEPENDENT_AMBULATORY_CARE_PROVIDER_SITE_OTHER): Payer: Self-pay | Admitting: Gastroenterology

## 2019-06-26 ENCOUNTER — Ambulatory Visit (INDEPENDENT_AMBULATORY_CARE_PROVIDER_SITE_OTHER): Payer: 59 | Admitting: Gastroenterology

## 2019-06-26 VITALS — BP 117/76 | HR 63 | Temp 97.7°F | Ht 68.0 in | Wt 187.3 lb

## 2019-06-26 DIAGNOSIS — K760 Fatty (change of) liver, not elsewhere classified: Secondary | ICD-10-CM | POA: Diagnosis not present

## 2019-06-26 NOTE — Patient Instructions (Addendum)
Diet modifications as we discussed - continue exercising, you are doing great   Continue limiting alcohol   To notify us if liver enzymes become elevated on routine PCP lab checks   Follow up 1 year with goal of weight loss prior to yearly visit next year

## 2019-06-26 NOTE — Progress Notes (Signed)
Patient profile: Krista Baker is a 44 y.o. female seen for evaluation of NALFD . Last seen in clinic on 06/2018.  History of Present Illness: Krista Baker is seen today for follow-up of fatty liver.  She feels well without any GI symptoms.  Has a daily bowel movement without abdominal pain, constipation, diarrhea, melena, rectal bleeding.  No upper GI symptoms.  No GERD, nausea, vomiting, epigastric pain.  We discussed fatty liver disease in detail-she drinks alcohol a few times a year in moderation, last drink was New Year's Eve.  She has been trying to lose weight but this has been difficult.  She is exercising approximately 4 times a week with combination of cardio and weightlifting.  Typical breakfast is bacon and eggs, typical lunch is a sandwich, typical dinner is meat and vegetables, does limit red meats. Does not drink sodas. Does tend to have unhealthy snacks.  Baseline weight prior to when she had her children 10 years ago was 140 pounds.  She has had trouble getting below #185 since   Wt Readings from Last 3 Encounters:  06/26/19 187 lb 4.8 oz (85 kg)  06/22/18 191 lb 8 oz (86.9 kg)  05/07/14 190 lb (86.2 kg)     Last Colonoscopy: None prior Last Endoscopy: None prior   Past Medical History:  Past Medical History:  Diagnosis Date  . Depression   . Hypertension   . Hypothyroid   . Migraine     Problem List: Patient Active Problem List   Diagnosis Date Noted  . Migraine without aura and without status migrainosus, not intractable 05/07/2014  . Headache, migraine 02/02/2014  . Headache(784.0) 11/02/2013  . Depression 11/02/2013  . Anal Fissure, posterior recurrent 02/16/2011  . Constipation, chronic 02/16/2011    Past Surgical History: Past Surgical History:  Procedure Laterality Date  . CERVIX SURGERY  1998   laser surgery to remove abnormalities   . MASTOIDECTOMY  2002    Allergies: No Known Allergies    Home Medications:  Current Outpatient  Medications:  .  levothyroxine (SYNTHROID, LEVOTHROID) 88 MCG tablet, Take 88 mcg by mouth daily before breakfast., Disp: , Rfl:  .  lisinopril-hydrochlorothiazide (PRINZIDE,ZESTORETIC) 10-12.5 MG tablet, Take 1 tablet by mouth daily., Disp: , Rfl:  .  Multiple Vitamins-Minerals (CENTRUM WOMEN PO), Take by mouth., Disp: , Rfl:  .  norethindrone-ethinyl estradiol-iron (LOESTRIN FE) 1.5-30 MG-MCG tablet, Take 1 tablet by mouth daily., Disp: , Rfl:  .  OVER THE COUNTER MEDICATION, Liposine with vitamin C - patient takes 2 by mouth daily., Disp: , Rfl:  .  venlafaxine XR (EFFEXOR-XR) 75 MG 24 hr capsule, Take 75 mg by mouth daily with breakfast., Disp: , Rfl:  .  VITAMIN D PO, Take by mouth daily., Disp: , Rfl:  .  ZINC OXIDE PO, Take by mouth daily., Disp: , Rfl:    Family History: family history includes Breast cancer in her maternal aunt; Cancer (age of onset: 11) in her maternal grandfather.  Mom's sister - Crohn's Mom's father w/ colon cancer     Social History:   reports that she has never smoked. She has never used smokeless tobacco. She reports current alcohol use. She reports that she does not use drugs.   Review of Systems: Constitutional: + weight loss Eyes: No changes in vision. ENT: No oral lesions, sore throat.  GI: see HPI.  Heme/Lymph: No easy bruising.  CV: No chest pain.  GU: No hematuria.  Integumentary: No rashes.  Neuro:  No headaches.  Psych: No depression/anxiety.  Endocrine: No heat/cold intolerance.  Allergic/Immunologic: No urticaria.  Resp: No cough, SOB.  Musculoskeletal: No joint swelling.    Physical Examination: BP 117/76 (BP Location: Right Arm, Patient Position: Sitting, Cuff Size: Large)   Pulse 63   Temp 97.7 F (36.5 C) (Temporal)   Ht 5\' 8"  (1.727 m)   Wt 187 lb 4.8 oz (85 kg)   BMI 28.48 kg/m  Gen: NAD, alert and oriented x 4 HEENT: PEERLA, EOMI, Neck: supple, no JVD Chest: CTA bilaterally, no wheezes, crackles, or other adventitious  sounds CV: RRR, no m/g/c/r Abd: soft, NT, ND, +BS in all four quadrants; no HSM, guarding, ridigity, or rebound tenderness Ext: no edema, well perfused with 2+ pulses, Skin: no rash or lesions noted on observed skin Lymph: no noted LAD  Data Reviewed:  06/2018-LFTS normal  12/2018-LFTS normal   Fibroscan - ULTRASOUND ABDOMEN: 1. Diffuse fatty infiltration of the liver but no focal hepatic lesions. 2. Normal directional flow in the portal vein. 3. Normal appearance of the pancreas, spleen and both kidneys. Corresponding Metavir fibrosis score:  F0/F1  Feb 2020 CT [place]-severe hepatic steatosis which has mildly worsened compared to prior study.  Liver appears mildly enlarged  Assessment/Plan: Ms. Severs is a 44 y.o. female    Krista Baker was seen today for follow-up.  Diagnoses and all orders for this visit:  Fatty liver disease, nonalcoholic   1.  Fatty liver disease-incidentally diagnosed on a CT for abdominal pain.  Her LFTs have been normal over the past year.  Her PCP monitors these every 6 months with her thyroid labs and will request most recent, also request most recent lipid panel. FibroScan last year with fatty infiltration but no significant fibrosis.  We discussed diet and lifestyle modifications-she overall eats a fairly balanced diet but is going to try to eat healthier snacks..  She is getting discussed nutrition visit with her PCP.  She is getting exercise 4 times a week.   Follow-up in 1 year-she will try and lose weight prior to her next visit, consider repeat US at that time.  She is contact me in interim with any symptoms.  She feels well today. No GI symptoms.     I personally performed the service, non-incident to. (WP)  Laurine Blazer, [facility]

## 2019-06-28 ENCOUNTER — Ambulatory Visit (INDEPENDENT_AMBULATORY_CARE_PROVIDER_SITE_OTHER): Payer: BLUE CROSS/BLUE SHIELD | Admitting: Nurse Practitioner

## 2020-06-14 ENCOUNTER — Other Ambulatory Visit: Payer: Self-pay | Admitting: Obstetrics and Gynecology

## 2020-06-14 DIAGNOSIS — Z9189 Other specified personal risk factors, not elsewhere classified: Secondary | ICD-10-CM

## 2020-06-26 ENCOUNTER — Ambulatory Visit (INDEPENDENT_AMBULATORY_CARE_PROVIDER_SITE_OTHER): Payer: 59 | Admitting: Gastroenterology

## 2020-06-27 ENCOUNTER — Ambulatory Visit (INDEPENDENT_AMBULATORY_CARE_PROVIDER_SITE_OTHER): Payer: 59 | Admitting: Gastroenterology

## 2020-06-27 ENCOUNTER — Encounter (INDEPENDENT_AMBULATORY_CARE_PROVIDER_SITE_OTHER): Payer: Self-pay | Admitting: Gastroenterology

## 2020-06-27 ENCOUNTER — Telehealth (INDEPENDENT_AMBULATORY_CARE_PROVIDER_SITE_OTHER): Payer: Self-pay

## 2020-06-27 NOTE — Telephone Encounter (Signed)
noted 

## 2020-06-27 NOTE — Telephone Encounter (Signed)
Patient no showed for her appointment with Dr. Katrinka Blazing 06/27/2020.

## 2021-05-27 ENCOUNTER — Other Ambulatory Visit: Payer: Self-pay

## 2021-05-27 ENCOUNTER — Encounter: Payer: Self-pay | Admitting: Allergy & Immunology

## 2021-05-27 ENCOUNTER — Ambulatory Visit: Payer: 59 | Admitting: Allergy & Immunology

## 2021-05-27 VITALS — BP 118/70 | HR 63 | Temp 97.9°F | Resp 18 | Ht 68.0 in | Wt 175.8 lb

## 2021-05-27 DIAGNOSIS — L508 Other urticaria: Secondary | ICD-10-CM

## 2021-05-27 DIAGNOSIS — T7800XA Anaphylactic reaction due to unspecified food, initial encounter: Secondary | ICD-10-CM

## 2021-05-27 DIAGNOSIS — T7800XD Anaphylactic reaction due to unspecified food, subsequent encounter: Secondary | ICD-10-CM

## 2021-05-27 MED ORDER — PEPCID 20 MG PO TABS: 20.0000 mg | ORAL_TABLET | Freq: Every day | ORAL | 1 refills | Status: DC

## 2021-05-27 NOTE — Progress Notes (Signed)
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  - Copy of testing results provided. - EpiPen is up-to-date. - I would like you to consider starting Xolair for long-term management of these cross contamination episodes.  2. Chronic urticaria - Testing to the entire environmental allergy panel was positive only to 1 tree, which I do not think is elevated. - Copy of testing results provided. - Your history does not have any "red flags" such as fevers, joint pains, or permanent skin changes that would be concerning for a more serious cause of hives.  - We will get some labs to rule out serious causes of hives (in case we are dealing with more than just alpha gal): alpha gal panel, complete blood count, tryptase level, chronic urticaria panel, CMP, ESR, and CRP. - Chronic hives are often times a self limited process and will "burn themselves out" over 6-12 months, although this is not always the case.  - In the meantime, start suppressive dosing of antihistamines:   - Morning: Allegra (fexofenadine) 154m   - Evening: Allegra (fexofenadine) 1854m  - If the above is not working, try adding: Pepcid (famotidine) 2069m You can change this dosing at home, decreasing the dose as needed or increasing the dosing as needed.  - Information on Xolair provided.  3. Return in about 6 weeks (around 07/08/2021) [place] in its [place] was forwarded to the Provider who requested this consultation.  Subjective:    Krista Baker a 45 71o. female presenting today for evaluation of  Chief Complaint  Patient presents with   Allergy Testing    Krista DOWNENs a history of the following: Patient Active Problem List   Diagnosis Date Noted   Migraine without aura and without status migrainosus, not intractable 05/07/2014   Headache, migraine 02/02/2014   Headache(784.0) 11/02/2013   Depression 11/02/2013   Anal Fissure, posterior recurrent 02/16/2011   Constipation, chronic 02/16/2011    History obtained from: chart review and patient.  Krista Baker referred by Krista Baker.     TabWoodie a [age]o. female presenting for an evaluation of possible food allergies .  She had problems in August with "sulfur burps" and vomiting with diarrhea. She had recently started SaeOmand she thought that this was it. She thought that this was related. Then at the beach in October, they have prime rib and 3 hours after she woke up, she felt that she was having a heart attack. She ran to the bathroom and passed out completely. She had itching of her hands and feet. She estimates that she was out for an hour or so. When she returned, she scheduled and appointment with her PCP. She had the lab that demonstrated an IgE 3.70 to alpha gal.   She has been avoiding red meat as much as she could. She had one episode where she had [employer] fries and started vomiting. She is good about telling people that she has  this allergy. She had another episode where she woke up and took an allergy pill and she started vomiting. She noticed that it was a gel cap.   She does not have issues with dairy aside from nausea. She normal does fine drinking milk and suddenly she has developed vomiting from it. Symptoms have become more regular over time. They do not put her into the hospital. She does not take allergy medication daily.  She has been breaking out in hives. She noticed that it was worse from cooking  bacon. She has hives when she was chewing gum.   She does not have much in the way of environmental allergies, although she does get recurrent sinus infections in the spring and the fall.  She has never undergone environmental allergy testing.  She does have some sneezing occasionally.  There is no common allergen that seems to be related to all of her reactions.   Otherwise, there is no history of other atopic diseases, including asthma, food allergies, drug allergies, environmental allergies, stinging insect allergies, eczema, or contact dermatitis. There is no significant infectious history. Vaccinations are up to date.    Past Medical History: Patient Active Problem List   Diagnosis Date Noted   Migraine without aura and without status migrainosus, not intractable 05/07/2014   Headache, migraine 02/02/2014   Headache(784.0) 11/02/2013   Depression 11/02/2013   Anal Fissure, posterior recurrent 02/16/2011   Constipation, chronic 02/16/2011    Medication List:  Allergies as of 05/27/2021       Reactions   Lisinopril Nausea Only        Medication List        Accurate as of May 27, 2021 12:27 PM. If you have any questions, ask your nurse or doctor.          CENTRUM WOMEN PO Take by mouth.   levothyroxine 88 MCG tablet Commonly known as: SYNTHROID Take 88 mcg by mouth daily before breakfast.   lisinopril-hydrochlorothiazide 10-12.5 MG tablet Commonly known as: ZESTORETIC Take 1 tablet by mouth daily.   norethindrone-ethinyl estradiol-iron 1.5-30 MG-MCG tablet Commonly known as: LOESTRIN FE Take 1 tablet by mouth daily.   ondansetron 8 MG tablet Commonly known as: ZOFRAN Take 8 mg by mouth every 8 (eight) hours as needed for nausea or vomiting.   OVER THE COUNTER MEDICATION Liposine with vitamin C - patient takes 2 by mouth daily.   Pepcid 20 MG tablet Generic drug: famotidine Take 1 tablet (20 mg total) by mouth daily. Started by: Valentina Shaggy,  MD   Saxenda 18 MG/3ML Sopn Generic drug: Liraglutide -Weight Management Inject 3 mg into the skin daily.   venlafaxine XR 75 MG 24 hr capsule Commonly known as: EFFEXOR-XR Take 75 mg by mouth daily with breakfast.   VITAMIN D PO Take by mouth daily.   ZINC OXIDE PO Take by mouth daily.        Birth History: born at term without complications  Developmental History: non-contributory  Past Surgical History: Past Surgical History:  Procedure Laterality Date   CERVIX SURGERY  1998   laser surgery to remove abnormalities    MASTOIDECTOMY  2002     Family History: Family History  Problem Relation Age of Onset   Breast cancer Maternal Aunt    Cancer Maternal Grandfather 2       colon      Social History: Jemina lives at home with her family.  She has a house that is 44 years old.  There is wood with carpeting and bilateral in the main living areas and carpeting in the bedrooms.  There is a heat pump for heating and cooling.  They have central cooling as well.  There are dogs inside of the home and cats outside of the home.  She works as a Printmaker. There is a HEPA filter in the home. There is no tobacco exposure in the home.   Review of Systems  Constitutional: Negative.  Negative for fever, malaise/fatigue and weight loss.  HENT: Negative.  Negative for congestion, ear discharge and ear pain.   Eyes:  Negative for pain, discharge and redness.  Respiratory:  Negative for cough, sputum production, shortness of breath and wheezing.   Cardiovascular: Negative.  Negative for chest pain and palpitations.  Gastrointestinal:  Positive for abdominal pain, diarrhea, nausea and vomiting. Negative for heartburn.  Skin:  Positive for itching and rash.  Neurological:  Negative for dizziness and headaches.  Endo/Heme/Allergies:  Negative for environmental allergies. Does not bruise/bleed easily.      Objective:   Blood pressure 118/70, pulse 63, temperature 97.9 F  (36.6 C), temperature source Temporal, resp. rate 18, height 5' 8"  (1.727 m), weight 175 lb 12.8 oz (79.7 kg), SpO2 99 %. Body mass index is 26.73 kg/m.   Physical Exam:   Physical Exam Vitals reviewed.  Constitutional:      Appearance: She is well-developed.  HENT:     Head: Normocephalic and atraumatic.     Right Ear: Tympanic membrane, ear canal and external ear normal. No drainage, swelling or tenderness. Tympanic membrane is not injected, scarred, erythematous, retracted or bulging.     Left Ear: Tympanic membrane, ear canal and external ear normal. No drainage, swelling or tenderness. Tympanic membrane is not injected, scarred, erythematous, retracted or bulging.     Nose: No nasal deformity, septal deviation, mucosal edema or rhinorrhea.     Right Turbinates: Enlarged, swollen and pale.     Left Turbinates: Enlarged, swollen and pale.     Right Sinus: No maxillary sinus tenderness or frontal sinus tenderness.     Left Sinus: No maxillary sinus tenderness or frontal sinus tenderness.     Mouth/Throat:     Mouth: Mucous membranes are not pale and not dry.     Pharynx: Uvula midline.  Eyes:     General:        Right eye: No discharge.        Left eye: No discharge.     Conjunctiva/sclera: Conjunctivae normal.     Right eye: Right conjunctiva is not injected. No chemosis.    Left eye: Left conjunctiva is not injected. No chemosis.    Pupils: Pupils are equal, round, and reactive to light.  Cardiovascular:     Rate and Rhythm: Normal rate and regular rhythm.     Heart sounds: Normal heart sounds.  Pulmonary:     Effort: Pulmonary effort is normal. No tachypnea, accessory muscle usage or respiratory distress.     Breath sounds: Normal breath sounds. No wheezing, rhonchi or rales.     Comments: Moving air well in all lung fields. No increased work of breathing noted. Chest:     Chest wall: No tenderness.  Abdominal:     Tenderness: There is no abdominal tenderness. There is  no guarding or rebound.  Lymphadenopathy:     Head:     Right side of head: No submandibular, tonsillar or occipital adenopathy.     Left side of head:  No submandibular, tonsillar or occipital adenopathy.     Cervical: No cervical adenopathy.  Skin:    Coloration: Skin is not pale.     Findings: No abrasion, erythema, petechiae or rash. Rash is not papular, urticarial or vesicular.  Neurological:     Mental Status: She is alert.  Psychiatric:        Behavior: Behavior is cooperative.     Diagnostic studies:       Allergy Studies:     Airborne Adult Perc - 05/27/21 1035     Time Antigen Placed 1015    Allergen Manufacturer Lavella Hammock    Location Back    Number of Test 59    Panel 1 Select    1. Control-Buffer 50% Glycerol Negative    2. Control-Histamine 1 mg/ml 2+    3. Albumin saline Negative    4. [place] Negative    5. [place] Negative    6. Johnson Negative    7. [place] Blue Negative    8. Meadow Fescue Negative    9. Perennial Rye Negative    10. Sweet Vernal Negative    11. Timothy Negative    12. Cocklebur Negative    13. Burweed Marshelder Negative    14. Ragweed, short Negative    15. Ragweed, Giant Negative    16. Plantain,  English Negative    17. Lamb's Quarters Negative    18. Sheep Sorrell Negative    19. Rough Pigweed Negative    20. Marsh Elder, Rough Negative    21. Mugwort, Common Negative    22. Ash mix Negative    23. Birch mix Negative    24. Beech American Negative    25. Box, Elder 2+    26. Cedar, red Negative    27. Cottonwood, [place] Negative    28. Elm mix Negative    29. Hickory Negative    30. Maple mix Negative    31. Oak, [place] mix Negative    32. Pecan Pollen Negative    33. Pine mix Negative    34. Sycamore Eastern Negative    35. [place], Black Pollen Negative    36. Alternaria alternata Negative    37. Cladosporium Herbarum Negative    38. Aspergillus mix Negative    39. Penicillium mix Negative    40. Bipolaris  sorokiniana (Helminthosporium) Negative    41. Drechslera spicifera (Curvularia) Negative    42. Mucor plumbeus Negative    43. Fusarium moniliforme Negative    44. Aureobasidium pullulans (pullulara) Negative    45. Rhizopus oryzae Negative    46. Botrytis cinera Negative    47. Epicoccum nigrum Negative    48. Phoma betae Negative    49. Candida Albicans Negative    50. Trichophyton mentagrophytes Negative    51. Mite, D Farinae  5,000 AU/ml Negative    52. Mite, D Pteronyssinus  5,000 AU/ml Negative    53. Cat Hair 10,000 BAU/ml Negative    54.  Dog Epithelia Negative    55. Mixed Feathers Negative    56. Horse Epithelia Negative    57. Cockroach, German Negative    58. Mouse Negative    59. Tobacco Leaf Negative             Food Adult Perc - 05/27/21 1000     Time Antigen Placed 1015    Allergen Manufacturer Lavella Hammock    Location Back    Number of allergen test 7     Control-buffer 50%  Glycerol Negative    Control-Histamine 1 mg/ml 2+    3. Wheat Negative    5. Milk, cow Negative    7. Casein Negative    37. Pork Negative    40. Beef Negative    65. Karaya Gum Negative    66. Acacia (Arabic Gum) Negative                       Salvatore Marvel, MD [facility]

## 2021-05-27 NOTE — Patient Instructions (Addendum)
1. Anaphylactic shock due to food, subsequent encounter - Testing was negative as likely this today. - Copy of testing results provided. - EpiPen is up-to-date. - I would like you to consider starting Xolair for long-term management of these cross contamination episodes.  2. Chronic urticaria - Testing to the entire environmental allergy panel was positive only to 1 tree, which I do not think is elevated. - Copy of testing results provided. - Your history does not have any "red flags" such as fevers, joint pains, or permanent skin changes that would be concerning for a more serious cause of hives.  - We will get some labs to rule out serious causes of hives (in case we are dealing with more than just alpha gal): alpha gal panel, complete blood count, tryptase level, chronic urticaria panel, CMP, ESR, and CRP. - Chronic hives are often times a self limited process and will "burn themselves out" over 6-12 months, although this is not always the case.  - In the meantime, start suppressive dosing of antihistamines:   - Morning: Allegra (fexofenadine) 173m   - Evening: Allegra (fexofenadine) 1842m  - If the above is not working, try adding: Pepcid (famotidine) 2013m You can change this dosing at home, decreasing the dose as needed or increasing the dosing as needed.  - Information on Xolair provided.  3. Return in about 6 weeks (around 07/08/2021) in [place] Please inform us [place] any Emergency Department visits, hospitalizations, or changes in symptoms. Call us [place] going to the ED for breathing or allergy symptoms since we might be able to fit you in for a sick visit. Feel free to contact us [place] with any questions, problems, or concerns.  It was a pleasure to meet you today!  Websites that have reliable patient information: 1. American Academy of Asthma, Allergy, and Immunology: www.aaaai.org 2. Food Allergy Research and Education (FARE): foodallergy.org 3. Mothers of Asthmatics:  http://www.asthmacommunitynetwork.org 4. American College of Allergy, Asthma, and Immunology: www.acaai.org   COVID-19 Vaccine Information can be found at: [email or web address] questions related to vaccine distribution or appointments, please email [email or web address] or call [phone] We realize that you might be concerned about having an allergic reaction to the COVID19 vaccines. To help with that concern, WE ARE OFFERING THE COVID19 VACCINES IN OUR OFFICE! Ask the front desk for dates!     Like us [place] [facility] Instagram for our latest updates!      A healthy democracy works best when ALLNew York Life Insurancerticipate! Make sure you are registered to vote! If you have moved or changed any of your contact information, you will need to get this updated before voting!  In some cases, you MAY be able to register to vote online: [email or web address]   Airborne Adult Perc - 05/27/21 1035     Time Antigen Placed 101ParkerfieldeLavella Hammock Location Back    Number of Test 59    Panel 1 Select    1. Control-Buffer 50% Glycerol Negative    2. Control-Histamine 1 mg/ml 2+    3. Albumin saline Negative    4. BahSky Lakegative    5. BerGuatemalagative    6. Johnson Negative    7. [place]ue Negative    8. Meadow Fescue Negative    9. Perennial Rye Negative    10. Sweet Vernal Negative    11. Timothy Negative    12. Cocklebur Negative  13. Burweed Marshelder Negative    14. Ragweed, short Negative    15. Ragweed, Giant Negative    16. Plantain,  English Negative    17. Lamb's Quarters Negative    18. Sheep Sorrell Negative    19. Rough Pigweed Negative    20. Marsh Elder, Rough Negative    21. Mugwort, Common Negative    22. Ash mix Negative    23. Birch mix Negative    24. Beech American Negative    25. Box, Elder 2+    26. Cedar, red Negative    27. Cottonwood, [place]  Negative    28. Elm mix Negative    29. Hickory Negative    30. Maple mix Negative    31. Oak, [place] mix Negative    32. Pecan Pollen Negative    33. Pine mix Negative    34. Sycamore Eastern Negative    35. [place], Black Pollen Negative    36. Alternaria alternata Negative    37. Cladosporium Herbarum Negative    38. Aspergillus mix Negative    39. Penicillium mix Negative    40. Bipolaris sorokiniana (Helminthosporium) Negative    41. Drechslera spicifera (Curvularia) Negative    42. Mucor plumbeus Negative    43. Fusarium moniliforme Negative    44. Aureobasidium pullulans (pullulara) Negative    45. Rhizopus oryzae Negative    46. Botrytis cinera Negative    47. Epicoccum nigrum Negative    48. Phoma betae Negative    49. Candida Albicans Negative    50. Trichophyton mentagrophytes Negative    51. Mite, D Farinae  5,000 AU/ml Negative    52. Mite, D Pteronyssinus  5,000 AU/ml Negative    53. Cat Hair 10,000 BAU/ml Negative    54.  Dog Epithelia Negative    55. Mixed Feathers Negative    56. Horse Epithelia Negative    57. Cockroach, German Negative    58. Mouse Negative    59. Tobacco Leaf Negative             Food Adult Perc - 05/27/21 1000     Time Antigen Placed 1015    Allergen Manufacturer Lavella Hammock    Location Back    Number of allergen test 7     Control-buffer 50% Glycerol Negative    Control-Histamine 1 mg/ml 2+    3. Wheat Negative    5. Milk, cow Negative    7. Casein Negative    37. Pork Negative    40. Beef Negative    65. Karaya Gum Negative    66. Acacia (Arabic Gum) Negative

## 2021-05-30 LAB — ALPHA-GAL PANEL
Allergen Lamb IgE: 0.15 kU/L — AB
Beef IgE: 0.4 kU/L — AB
IgE (Immunoglobulin E), Serum: 74 IU/mL (ref 6–495)
O215-IgE Alpha-Gal: 1.49 kU/L — AB
Pork IgE: 0.13 kU/L — AB

## 2021-06-04 LAB — CMP14+EGFR
ALT: 18 IU/L (ref 0–32)
AST: 21 IU/L (ref 0–40)
Albumin/Globulin Ratio: 1.7 (ref 1.2–2.2)
Albumin: 4.7 g/dL (ref 3.8–4.8)
Alkaline Phosphatase: 54 IU/L (ref 44–121)
BUN/Creatinine Ratio: 14 (ref 9–23)
BUN: 14 mg/dL (ref 6–24)
Bilirubin Total: 0.6 mg/dL (ref 0.0–1.2)
CO2: 22 mmol/L (ref 20–29)
Calcium: 9.3 mg/dL (ref 8.7–10.2)
Chloride: 105 mmol/L (ref 96–106)
Creatinine, Ser: 1.01 mg/dL — ABNORMAL HIGH (ref 0.57–1.00)
Globulin, Total: 2.8 g/dL (ref 1.5–4.5)
Glucose: 83 mg/dL (ref 70–99)
Potassium: 4.4 mmol/L (ref 3.5–5.2)
Sodium: 142 mmol/L (ref 134–144)
Total Protein: 7.5 g/dL (ref 6.0–8.5)
eGFR: 71 mL/min/{1.73_m2} (ref >59)

## 2021-06-04 LAB — SEDIMENTATION RATE: Sed Rate: 14 mm/hr (ref 0–32)

## 2021-06-04 LAB — CBC WITH DIFFERENTIAL
Basophils Absolute: 0 10*3/uL (ref 0.0–0.2)
Basos: 1 %
EOS (ABSOLUTE): 0.2 10*3/uL (ref 0.0–0.4)
Eos: 2 %
Hematocrit: 45.7 % (ref 34.0–46.6)
Hemoglobin: 15.1 g/dL (ref 11.1–15.9)
Immature Grans (Abs): 0 10*3/uL (ref 0.0–0.1)
Immature Granulocytes: 0 %
Lymphocytes Absolute: 1.9 10*3/uL (ref 0.7–3.1)
Lymphs: 29 %
MCH: 29.9 pg (ref 26.6–33.0)
MCHC: 33 g/dL (ref 31.5–35.7)
MCV: 91 fL (ref 79–97)
Monocytes Absolute: 0.4 10*3/uL (ref 0.1–0.9)
Monocytes: 5 %
Neutrophils Absolute: 4 10*3/uL (ref 1.4–7.0)
Neutrophils: 63 %
RBC: 5.05 x10E6/uL (ref 3.77–5.28)
RDW: 12.6 % (ref 11.7–15.4)
WBC: 6.5 10*3/uL (ref 3.4–10.8)

## 2021-06-04 LAB — TRYPTASE: Tryptase: 5.9 ug/L (ref 2.2–13.2)

## 2021-06-04 LAB — ANTINUCLEAR ANTIBODIES, IFA: ANA Titer 1: NEGATIVE

## 2021-06-04 LAB — THYROID ANTIBODIES
Thyroglobulin Antibody: <1 IU/mL (ref 0.0–0.9)
Thyroperoxidase Ab SerPl-aCnc: 9 IU/mL (ref 0–34)

## 2021-06-04 LAB — C-REACTIVE PROTEIN: CRP: 8 mg/L (ref 0–10)

## 2021-06-04 LAB — CHRONIC URTICARIA: cu index: <2.5 (ref <10)

## 2021-06-19 ENCOUNTER — Other Ambulatory Visit: Payer: Self-pay | Admitting: Allergy & Immunology

## 2021-07-11 ENCOUNTER — Encounter: Payer: Self-pay | Admitting: Allergy & Immunology

## 2021-07-11 ENCOUNTER — Ambulatory Visit: Payer: 59 | Admitting: Allergy & Immunology

## 2021-07-11 ENCOUNTER — Other Ambulatory Visit: Payer: Self-pay

## 2021-07-11 VITALS — BP 130/90 | HR 76 | Temp 98.5°F | Resp 18 | Ht 68.0 in | Wt 180.0 lb

## 2021-07-11 DIAGNOSIS — L508 Other urticaria: Secondary | ICD-10-CM | POA: Diagnosis not present

## 2021-07-11 DIAGNOSIS — T7800XD Anaphylactic reaction due to unspecified food, subsequent encounter: Secondary | ICD-10-CM

## 2021-07-11 MED ORDER — BACTROBAN NASAL 2 % NA OINT: 1.0000 "application " | TOPICAL_OINTMENT | Freq: Two times a day (BID) | NASAL | 0 refills | Status: AC

## 2021-07-11 NOTE — Patient Instructions (Addendum)
1. Anaphylactic shock due to food ?- EpiPen is up-to-date. ? ?2. Chronic urticaria - with alpha gal ?- Continue to avoid mammalian meats.  ?- We can check again at the end of the year to see where the IgE level to alpha gal is trending.  ?- Thankfully, the rest of the workup was negative for serious causes of hives.  ?- We are going to DECREASE Allegra to once daily to see if this helps with the sores in your nose. ?- Wait two weeks and then stop the Pepcid then if your hives are still under good control.  ?- In the meantime, start suppressive dosing of antihistamines:  ? - Morning: Allegra (fexofenadine) 180mg   ?- You can change this dosing at home, decreasing the dose as needed or increasing the dosing as needed.  ? ?3. Nasal lesion ?- Start Bactroban twice daily on the lesions for one week. ?- Apply with a Q-tip.  ? ?4. Return in about 6 months (around 01/11/2022).  ? ? ?Please inform 03/13/2022 of any Emergency Department visits, hospitalizations, or changes in symptoms. Call us before going to the ED for breathing or allergy symptoms since we might be able to fit you in for a sick visit. Feel free to contact us anytime with any questions, problems, or concerns. ? ?It was a pleasure to see you again today! ? ?Websites that have reliable patient information: ?1. American Academy of Asthma, Allergy, and Immunology: www.aaaai.org ?2. Food Allergy Research and Education (FARE): foodallergy.org ?3. Mothers of Asthmatics: http://www.asthmacommunitynetwork.org ?4. [place] of Allergy, Asthma, and Immunology: [employer] ? ? ?COVID-19 Vaccine Information can be found at: MissingWeapons.ca For questions related to vaccine distribution or appointments, please email [email or web address] or call [phone].  ? ?We realize that you might be concerned about having an allergic reaction to the COVID19 vaccines. To help with that concern, WE ARE OFFERING THE  COVID19 VACCINES IN OUR OFFICE! Ask the front desk for dates!  ? ? ? ??Like? [phone] on Facebook and Instagram for our latest updates!  ?  ? ? ?A healthy democracy works best when [place] participate! Make sure you are registered to vote! If you have moved or changed any of your contact information, you will need to get this updated before voting! ? ?In some cases, you MAY be able to register to vote online: [employer] ? ? ? ? ? ? ? ? ? ?

## 2021-07-11 NOTE — Progress Notes (Signed)
? ?FOLLOW UP ? ?Date of Service/Encounter:  07/11/21 ? ? ?Assessment:  ? 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  ?Chronic urticaria - checking for other causes of urticaria just in case we are dealing with more than one etiology (consider starting Xolair if no improvement with the daily antihistamine) ? ?Nasal sores ? ?Plan/Recommendations:  ? ? ?1. Anaphylactic shock due to food ?- EpiPen is up-to-date. ? ?2. Chronic urticaria - with alpha gal ?- Continue to avoid mammalian meats.  ?- We can check again at the end of the year to see where the IgE level to alpha gal is trending.  ?- Thankfully, the rest of the workup was negative for serious causes of hives.  ?- We are going to DECREASE Allegra to once daily to see if this helps with the sores in your nose. ?- Wait two weeks and then stop the Pepcid then if your hives are still under good control.  ?- In the meantime, start suppressive dosing of antihistamines:  ? - Morning: Allegra (fexofenadine) 180mg   ?- You can change this dosing at home, decreasing the dose as needed or increasing the dosing as needed.  ? ?3. Nasal lesion ?- Start Bactroban twice daily on the lesions for one week. ?- Apply with a Q-tip.  ? ?4. Return in about 6 months (around 01/11/2022).  ? ? ?Subjective:  ? ?Krista Baker is a 46 y.o. female presenting today for follow up of  ?Chief Complaint  ?Patient presents with  ? Urticaria  ?  No issues   ? ? ?Krista Baker has a history of the following: ?Patient Active Problem List  ? Diagnosis Date Noted  ? Migraine without aura and without status migrainosus, not intractable 05/07/2014  ? Headache, migraine 02/02/2014  ? Headache(784.0) 11/02/2013  ? Depression 11/02/2013  ? 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 Constipation, chronic 02/16/2011  ? ? ?History obtained from: chart review and patient. ? ?Krista Baker is a 46 y.o. female presenting for a follow up visit.  She was  last seen in January 2023 as a new patient.  At that time, she was evaluated for urticaria.  We did testing to selected foods and it was all negative.  She did already have a diagnosis of alpha gal and we repeated that level and it had decreased. We did environmental allergy testing and it was positive only to box elder. We did not do intradermal testing since allergic rhinitis was not a chief complaint and symptoms did not seem too severe.  We put her on Allegra twice a day with Pepcid added as well.  We obtained a lot of labs which were largely normal aside from alpha gal which had decreased. ? ?She has largely done well since the last visit.  ? ?Allergic Rhinitis Symptom History: She does report that the Allegra has helped with her postnasal drip and allergic rhinitis symptoms.  She did not realize her symptoms were that severe until she actually took allergy medication.  However, she does report some sores on the outer edge of her nostrils from the Allegra.  She thinks it might just be from over drying.  She has not been using anything aside from Vaseline.  There has been no bleeding. ? ?Food Allergy Symptom History: She did go into a February 2023 and she had an accidental exposure to a beef enchilada, even though she asked for chicken.  IgE to  alpha gal was 3.7 in November 2022 and then 1.49 last time we saw her in January 2023. She does not go out into the woods often at all. She was in the woods with her kids and ran into a tick nest. She does tell me that [name] [facility] [place] did an excellent job when she told them that she had a beef allergy. The person who took her order instructed the cook to change gloves and use different utensils.  ? ?Skin Symptom History: Hives have been well controlled.  The only outbreak she had was when she has a few times a lot of accidentally at the [employer].   ? ?Otherwise, there have been no changes to her past medical history, surgical history, family history,  or social history. ? ? ? ?Review of Systems  ?Constitutional: Negative.  Negative for chills, fever, malaise/fatigue and weight loss.  ?HENT: Negative.  Negative for congestion, ear discharge and ear pain.   ?Eyes:  Negative for pain, discharge and redness.  ?Respiratory:  Negative for cough, sputum production, shortness of breath and wheezing.   ?Cardiovascular: Negative.  Negative for chest pain and palpitations.  ?Gastrointestinal:  Negative for abdominal pain, constipation, diarrhea, heartburn, nausea and vomiting.  ?Skin: Negative.  Negative for itching and rash.  ?Neurological:  Negative for dizziness and headaches.  ?Endo/Heme/Allergies:  Negative for environmental allergies. Does not bruise/bleed easily.   ? ? ? ?Objective:  ? ?Blood pressure 130/90, pulse 76, temperature 98.5 ?F (36.9 ?C), resp. rate 18, height 5\' 8"  (1.727 m), weight 180 lb (81.6 kg), SpO2 97 %. ?Body mass index is 27.37 kg/m?. ? ? ? ?Physical Exam ?Vitals reviewed.  ?Constitutional:   ?   Appearance: She is well-developed.  ?HENT:  ?   Head: Normocephalic and atraumatic.  ?   Right Ear: Tympanic membrane, ear canal and external ear normal.  ?   Left Ear: Tympanic membrane, ear canal and external ear normal.  ?   Nose: Rhinorrhea present. No nasal deformity, septal deviation or mucosal edema.  ?   Right Turbinates: Enlarged. Not swollen.  ?   Left Turbinates: Enlarged. Not swollen.  ?   Right Sinus: No maxillary sinus tenderness or frontal sinus tenderness.  ?   Left Sinus: No maxillary sinus tenderness or frontal sinus tenderness.  ?   Comments: She does have an eschar at the entrance of each nasal cavity.  ?   Mouth/Throat:  ?   Mouth: Mucous membranes are not pale and not dry.  ?   Pharynx: Uvula midline.  ?Eyes:  ?   General: Lids are normal. No allergic shiner.    ?   Right eye: No discharge.     ?   Left eye: No discharge.  ?   Conjunctiva/sclera: Conjunctivae normal.  ?   Right eye: Right conjunctiva is not injected. No chemosis. ?    Left eye: Left conjunctiva is not injected. No chemosis. ?   Pupils: Pupils are equal, round, and reactive to light.  ?Cardiovascular:  ?   Rate and Rhythm: Normal rate and regular rhythm.  ?   Heart sounds: Normal heart sounds.  ?Pulmonary:  ?   Effort: Pulmonary effort is normal. No tachypnea, accessory muscle usage or respiratory distress.  ?   Breath sounds: Normal breath sounds. No wheezing, rhonchi or rales.  ?Chest:  ?   Chest wall: No tenderness.  ?Lymphadenopathy:  ?   Cervical: No cervical adenopathy.  ?Skin: ?   Coloration:  Skin is not pale.  ?   Findings: No abrasion, erythema, petechiae or rash. Rash is not papular, urticarial or vesicular.  ?Neurological:  ?   Mental Status: She is alert.  ?Psychiatric:     ?   Behavior: Behavior is cooperative.  ?  ? ?Diagnostic studies: none ? ? ? ?  ?Malachi Bonds, MD  ?[facility] ? ? ? ? ? ? ?

## 2021-07-18 ENCOUNTER — Other Ambulatory Visit: Payer: Self-pay | Admitting: Allergy & Immunology

## 2021-07-28 IMAGING — MG MM DIGITAL DIAGNOSTIC UNILAT*L* W/ TOMO W/ CAD
4 series · 4 of 12 positions shown · non-contrast
Comparison: Previous exam(s).

CLINICAL DATA: Recall from screening mammography with
tomosynthesis, possible focal asymmetry involving the UPPER INNER
LEFT breast at MIDDLE to POSTERIOR depth.

EXAM:
DIGITAL DIAGNOSTIC LEFT MAMMOGRAM WITH TOMO
ULTRASOUND LEFT BREAST

[L CC synth-2D]
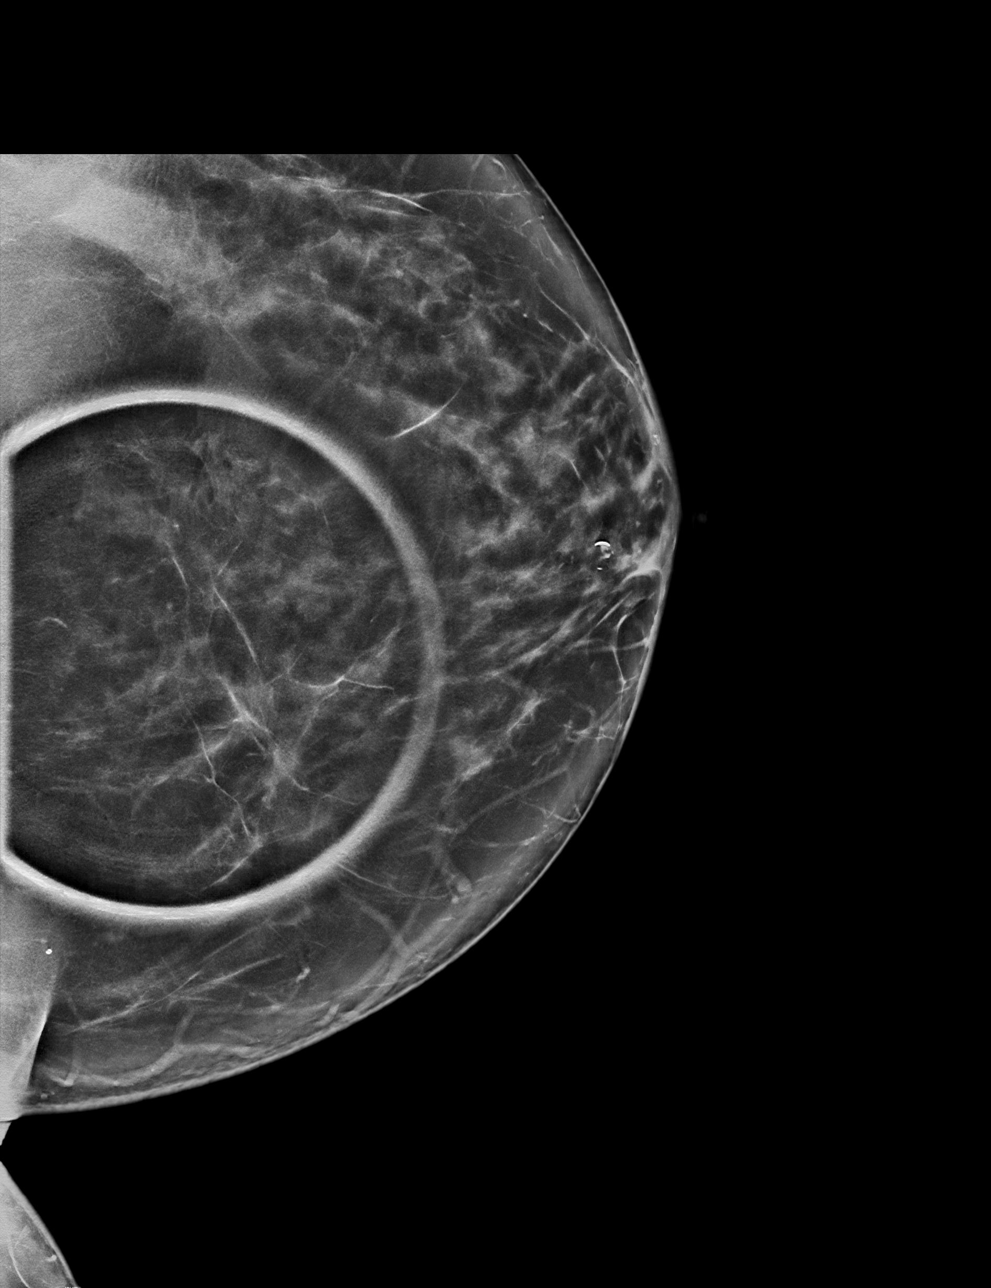

[L MLO synth-2D]
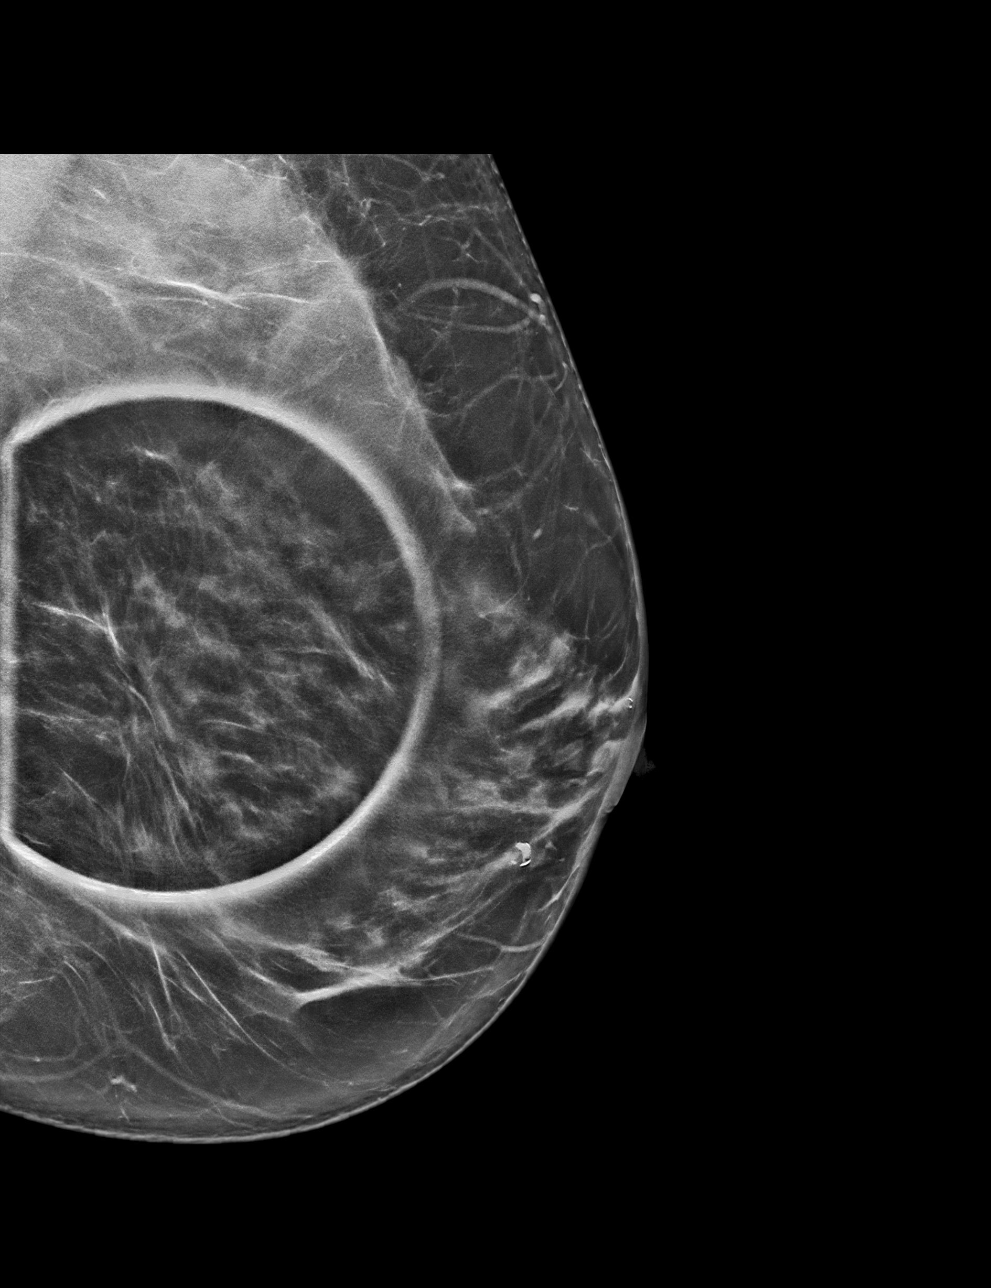

[L CC tomo · tomo slice 48/95.0]
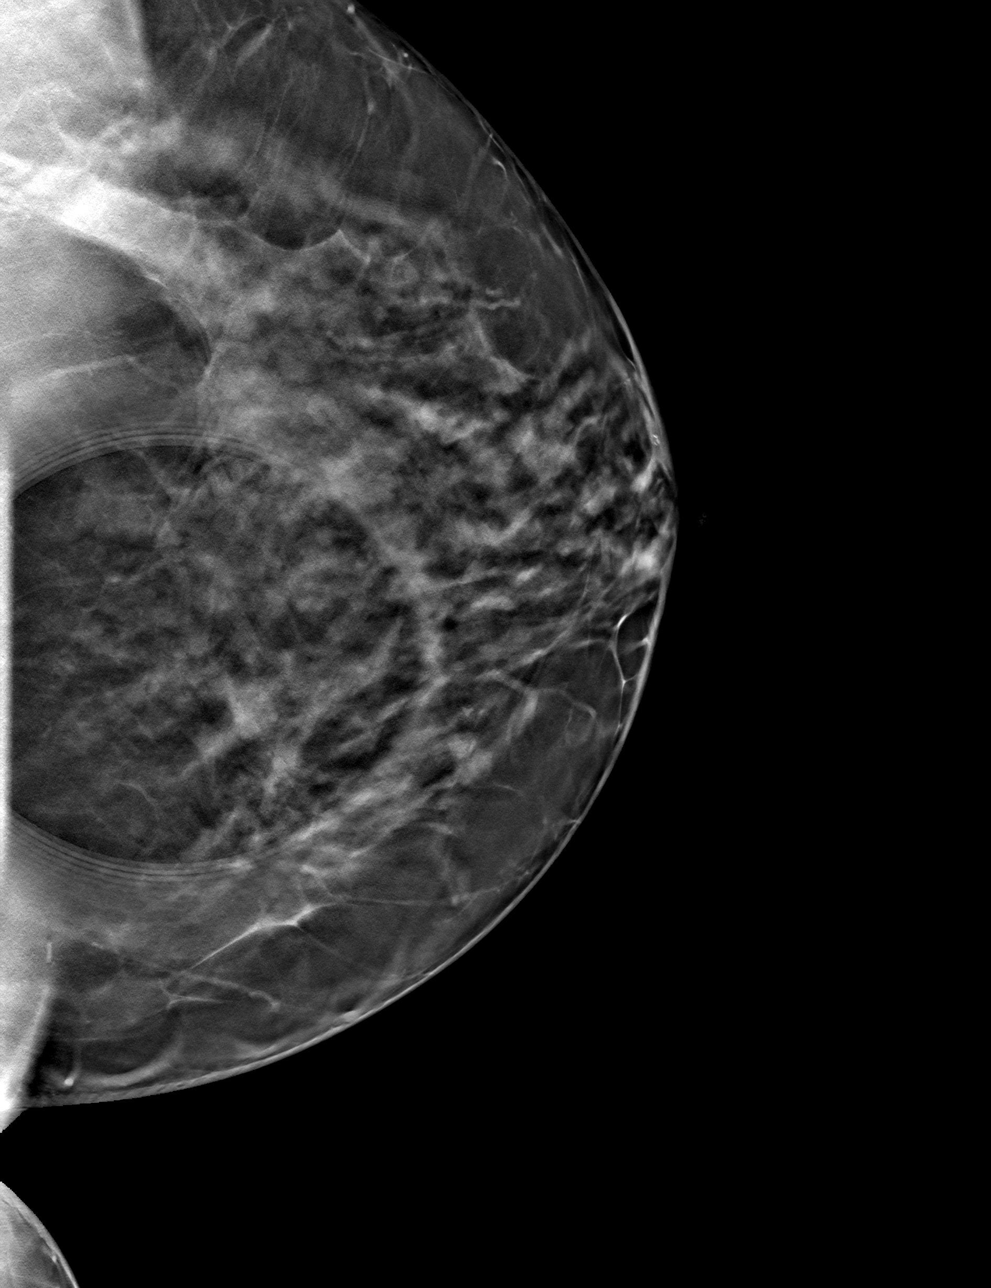

[L MLO tomo · tomo slice 41/82.0]
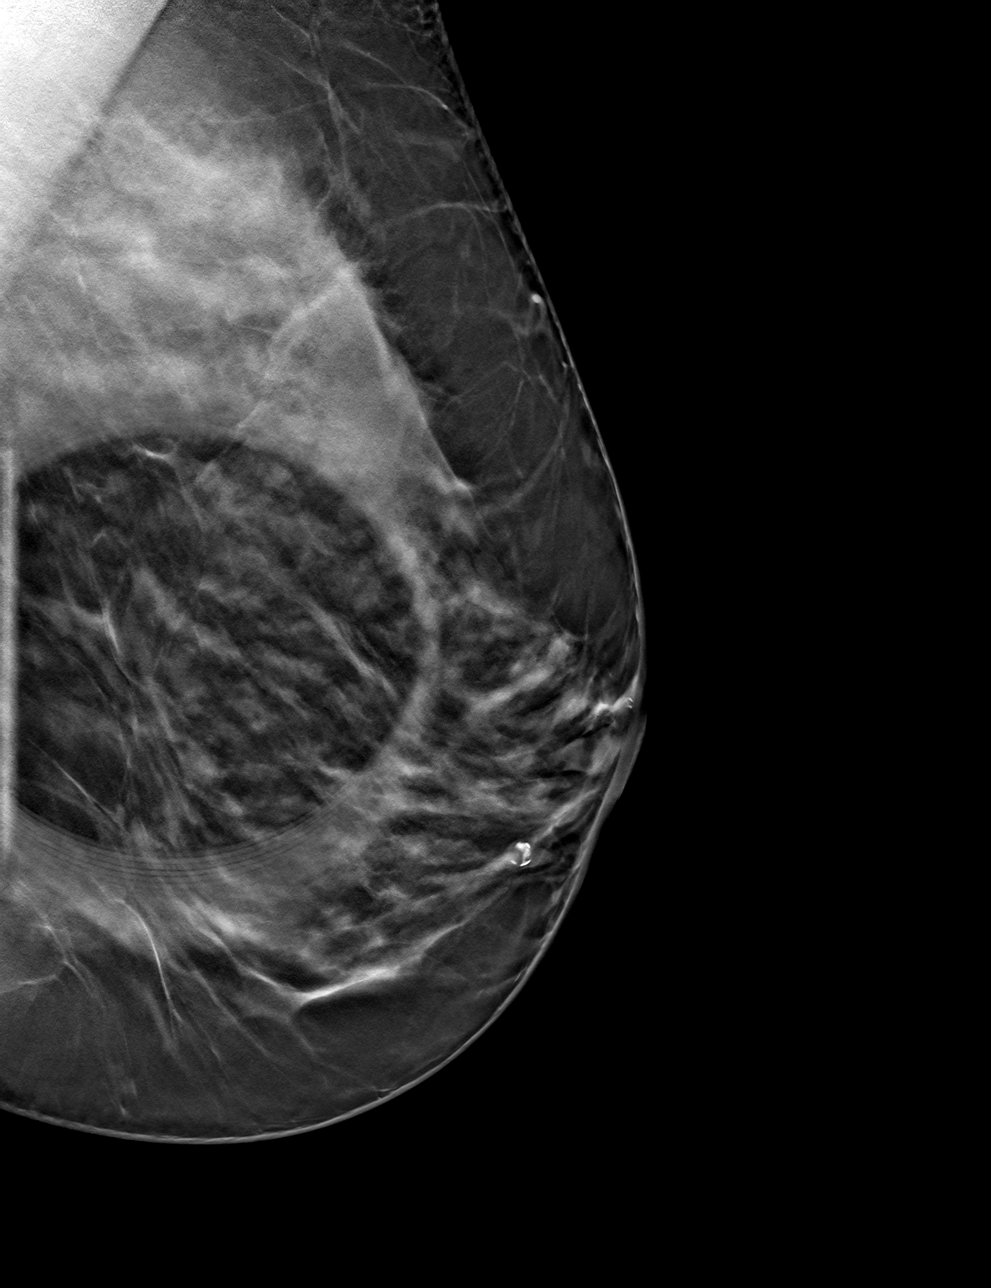

[4 of 12 positions shown; findings below may reference images not displayed]

ACR Breast Density Category c: The breast tissue is heterogeneously
dense, which may obscure small masses.
FINDINGS: Tomosynthesis and synthesized spot-compression CC and MLO views of
the area of concern in the LEFT breast were obtained.

The focal asymmetry questioned in the UPPER INNER QUADRANT disperses
with compression and there is no visible underlying mass. On the
spot compression MLO tomosynthesis images overlapping normal
Cooper's ligaments gives the appearance of subtle architectural
distortion, though this appearance is unchanged over multiple prior
mammograms.

Targeted LEFT breast ultrasound is performed, showing adjacent
benign simple cysts at the 11 o'clock position approximately 3 cm
from nipple, measuring approximately 4 x 8 x 9 mm and 5 x 8 x 8 mm.
No suspicious solid mass or abnormal acoustic shadowing is
identified.
IMPRESSION: 1. No mammographic or sonographic evidence of malignancy involving
the LEFT breast.
2. Benign simple cysts in the UPPER INNER QUADRANT of the LEFT
breast.

RECOMMENDATION:
Screening mammogram in one year.(Code:PY-[id number])

I have discussed the findings and recommendations with the patient.
If applicable, a reminder letter will be sent to the patient
regarding the next appointment.

BI-RADS CATEGORY  2: Benign.

## 2021-10-16 ENCOUNTER — Inpatient Hospital Stay: Admission: AD | Admit: 2021-10-16 | Payer: 59 | Source: Other Acute Inpatient Hospital | Admitting: Cardiology

## 2021-10-16 DIAGNOSIS — A419 Sepsis, unspecified organism: Secondary | ICD-10-CM | POA: Insufficient documentation

## 2021-10-17 DIAGNOSIS — I10 Essential (primary) hypertension: Secondary | ICD-10-CM | POA: Insufficient documentation

## 2021-10-19 DIAGNOSIS — Z91018 Allergy to other foods: Secondary | ICD-10-CM | POA: Insufficient documentation

## 2021-11-10 DIAGNOSIS — I2602 Saddle embolus of pulmonary artery with acute cor pulmonale: Secondary | ICD-10-CM | POA: Insufficient documentation

## 2021-12-07 ENCOUNTER — Other Ambulatory Visit: Payer: Self-pay | Admitting: Allergy & Immunology

## 2021-12-08 ENCOUNTER — Other Ambulatory Visit: Payer: Self-pay

## 2022-01-28 ENCOUNTER — Other Ambulatory Visit: Payer: Self-pay | Admitting: Obstetrics and Gynecology

## 2022-01-28 DIAGNOSIS — R928 Other abnormal and inconclusive findings on diagnostic imaging of breast: Secondary | ICD-10-CM

## 2022-02-11 ENCOUNTER — Ambulatory Visit
Admission: RE | Admit: 2022-02-11 | Discharge: 2022-02-11 | Disposition: A | Discharge status: Home / Self Care | Payer: 59 | Source: Ambulatory Visit | Attending: Obstetrics and Gynecology | Admitting: Obstetrics and Gynecology

## 2022-02-11 DIAGNOSIS — R928 Other abnormal and inconclusive findings on diagnostic imaging of breast: Secondary | ICD-10-CM

## 2022-02-11 HISTORY — DX: Encounter for nonprocreative screening for genetic disease carrier status: Z13.71

## 2022-07-11 ENCOUNTER — Other Ambulatory Visit: Payer: Self-pay | Admitting: Allergy & Immunology

## 2024-05-03 ENCOUNTER — Other Ambulatory Visit: Payer: Self-pay

## 2024-05-03 ENCOUNTER — Encounter (HOSPITAL_BASED_OUTPATIENT_CLINIC_OR_DEPARTMENT_OTHER): Payer: Self-pay

## 2024-05-03 ENCOUNTER — Emergency Department (HOSPITAL_BASED_OUTPATIENT_CLINIC_OR_DEPARTMENT_OTHER)
Admission: EM | Admit: 2024-05-03 | Discharge: 2024-05-03 | Disposition: A | Discharge status: Home / Self Care | Attending: Emergency Medicine | Admitting: Emergency Medicine

## 2024-05-03 DIAGNOSIS — Z7901 Long term (current) use of anticoagulants: Secondary | ICD-10-CM | POA: Diagnosis not present

## 2024-05-03 DIAGNOSIS — N3000 Acute cystitis without hematuria: Secondary | ICD-10-CM | POA: Insufficient documentation

## 2024-05-03 DIAGNOSIS — R55 Syncope and collapse: Secondary | ICD-10-CM | POA: Insufficient documentation

## 2024-05-03 DIAGNOSIS — R350 Frequency of micturition: Secondary | ICD-10-CM | POA: Diagnosis present

## 2024-05-03 LAB — COMPREHENSIVE METABOLIC PANEL WITH GFR
ALT: 24 U/L (ref 0–44)
AST: 31 U/L (ref 15–41)
Albumin: 4.6 g/dL (ref 3.5–5.0)
Alkaline Phosphatase: 57 U/L (ref 38–126)
Anion gap: 13 (ref 5–15)
BUN: 19 mg/dL (ref 6–20)
CO2: 25 mmol/L (ref 22–32)
Calcium: 9.4 mg/dL (ref 8.9–10.3)
Chloride: 100 mmol/L (ref 98–111)
Creatinine, Ser: 1.15 mg/dL — ABNORMAL HIGH (ref 0.44–1.00)
GFR, Estimated: 59 mL/min — ABNORMAL LOW
Glucose, Bld: 170 mg/dL — ABNORMAL HIGH (ref 70–99)
Potassium: 3.9 mmol/L (ref 3.5–5.1)
Sodium: 138 mmol/L (ref 135–145)
Total Bilirubin: 0.7 mg/dL (ref 0.0–1.2)
Total Protein: 7.7 g/dL (ref 6.5–8.1)

## 2024-05-03 LAB — CBC
HCT: 43.4 % (ref 36.0–46.0)
Hemoglobin: 14.2 g/dL (ref 12.0–15.0)
MCH: 30 pg (ref 26.0–34.0)
MCHC: 32.7 g/dL (ref 30.0–36.0)
MCV: 91.8 fL (ref 80.0–100.0)
Platelets: 259 K/uL (ref 150–400)
RBC: 4.73 MIL/uL (ref 3.87–5.11)
RDW: 12.7 % (ref 11.5–15.5)
WBC: 14.4 K/uL — ABNORMAL HIGH (ref 4.0–10.5)
nRBC: 0 % (ref 0.0–0.2)

## 2024-05-03 LAB — URINALYSIS, ROUTINE W REFLEX MICROSCOPIC
Bilirubin Urine: NEGATIVE
Glucose, UA: NEGATIVE mg/dL
Hgb urine dipstick: NEGATIVE
Ketones, ur: NEGATIVE mg/dL
Nitrite: POSITIVE — AB
Protein, ur: NEGATIVE mg/dL
Specific Gravity, Urine: 1.025 (ref 1.005–1.030)
pH: 5.5 (ref 5.0–8.0)

## 2024-05-03 LAB — URINALYSIS, MICROSCOPIC (REFLEX): WBC, UA: >50 WBC/hpf (ref 0–5)

## 2024-05-03 LAB — PREGNANCY, URINE: Preg Test, Ur: NEGATIVE

## 2024-05-03 MED ORDER — CEPHALEXIN 500 MG PO CAPS: 500.0000 mg | ORAL_CAPSULE | Freq: Four times a day (QID) | ORAL | 0 refills | Status: AC

## 2024-05-03 MED ORDER — CEPHALEXIN 250 MG PO CAPS
500.0000 mg | ORAL_CAPSULE | Freq: Once | ORAL | Status: AC
  Administered 2024-05-03: 500 mg via ORAL
  Filled 2024-05-03: qty 2

## 2024-05-03 MED ORDER — EPINEPHRINE 0.3 MG/0.3ML IJ SOAJ: 0.3000 mg | INTRAMUSCULAR | 1 refills | Status: AC | PRN

## 2024-05-03 NOTE — Discharge Instructions (Signed)
 Follow up with your doctor as needed. Take Keflex for a urinary tract infection as prescribed. You have been given a written prescription for EpiPen refills.   Please return to the ED with any new or concerning symptoms at any time.

## 2024-05-03 NOTE — ED Triage Notes (Signed)
 Pt reports hx of alpha gal syndrome and was eating at [facility]. Pt reports she passed out x15 seconds and son administered epi-pen.

## 2024-05-04 NOTE — ED Provider Notes (Signed)
 " [place] EMERGENCY DEPARTMENT AT [facility] Provider Note   CSN: [id number] Arrival date & time: 05/03/24  2050     Patient presents with: Loss of Consciousness   Krista Baker is a 49 y.o. female.   Patient to ED after brief syncopal episode just prior to arrival while at a restaurant. She reports she started getting hot and then passed out. No chest pain or dizziness. She was sitting at the time so no fall or injury. She and her husband report history of alpha gal with similar symptoms in the past, so husband administered EpiPen . She is currently back to baseline and asymptomatic. No rash, SOB, throat swelling or facial swelling. She does report urinary frequency without fever or dysuria.   The history is provided by the patient and the spouse. No language interpreter was used.  Loss of Consciousness      Prior to Admission medications  Medication Sig Start Date End Date Taking? Authorizing Provider  cephALEXin  (KEFLEX ) 500 MG capsule Take 1 capsule (500 mg total) by mouth 4 (four) times daily. 05/03/24  Yes Odell Balls, PA-C  EPINEPHrine  0.3 mg/0.3 mL IJ SOAJ injection Inject 0.3 mg into the muscle as needed for anaphylaxis. 05/03/24  Yes Journe [employer]mark, Balls, PA-C  ALPRAZolam (XANAX) 0.25 MG tablet Take 0.25 mg by mouth daily as needed. 11/24/21   [provider]  famotidine  (PEPCID ) 20 MG tablet Take 1 tablet (20 mg total) by mouth daily. 12/08/21   Iva Marty Saltness, MD  fondaparinux (ARIXTRA) 7.5 MG/0.6ML SOLN injection Inject into the skin. 10/20/21   [provider]  levothyroxine (SYNTHROID) 100 MCG tablet Take 100 mcg by mouth daily. 10/06/21   [provider]  lisinopril-hydrochlorothiazide (PRINZIDE,ZESTORETIC) 10-12.5 MG tablet Take 1 tablet by mouth daily.    [provider]  Multiple Vitamins-Minerals (CENTRUM WOMEN PO) Take by mouth.    [provider]  mupirocin  nasal ointment (BACTROBAN  NASAL) 2 % Place 1  application. into the nose 2 (two) times daily. Use one-half of tube in each nostril twice daily for five (5) days. After application, press sides of nose together and gently massage. 07/11/21   Iva Marty Saltness, MD  norethindrone-ethinyl estradiol-iron (JUNEL FE 1.5/30) 1.5-30 MG-MCG tablet Junel FE 1.5/30 (28) 1.5 mg-30 mcg (21)/75 mg (7) tablet  TAKE 1 TABLET BY MOUTH EVERY DAY    [provider]  ondansetron (ZOFRAN) 8 MG tablet Take 8 mg by mouth every 8 (eight) hours as needed for nausea or vomiting.    [provider]  OVER THE COUNTER MEDICATION Liposine with vitamin C - patient takes 2 by mouth daily.    [provider]  venlafaxine XR (EFFEXOR-XR) 75 MG 24 hr capsule Take 75 mg by mouth daily with breakfast.    [provider]  VITAMIN D PO Take by mouth daily.    [provider]  WEGOVY 1.7 MG/0.75ML SOAJ SMARTSIG:0.75 Milliliter(s) SUB-Q Once a Week 11/25/21   [provider]  XARELTO 20 MG TABS tablet Take 20 mg by mouth daily. 11/11/21   [provider]  ZINC OXIDE PO Take by mouth daily.    [provider]    Allergies: Lisinopril    Review of Systems  Cardiovascular:  Positive for syncope.    Updated Vital Signs BP 120/80   Pulse 75   Temp 99 F (37.2 C)   Resp 20   Ht 5' 8 (1.727 m)   Wt 81.2 kg   SpO2 98%  BMI 27.22 kg/m   Physical Exam Constitutional:      Appearance: She is well-developed.  HENT:     Head: Normocephalic.  Cardiovascular:     Rate and Rhythm: Normal rate and regular rhythm.     Heart sounds: No murmur heard. Pulmonary:     Effort: Pulmonary effort is normal.     Breath sounds: Normal breath sounds. No wheezing, rhonchi or rales.  Abdominal:     General: Bowel sounds are normal.     Palpations: Abdomen is soft.     Tenderness: There is no abdominal tenderness. There is no guarding or rebound.  Musculoskeletal:        General: Normal range of motion.      Cervical back: Normal range of motion and neck supple.  Skin:    General: Skin is warm and dry.  Neurological:     General: No focal deficit present.     Mental Status: She is alert and oriented to person, place, and time.     (all labs ordered are listed, but only abnormal results are displayed) Labs Reviewed  COMPREHENSIVE METABOLIC PANEL WITH GFR - Abnormal; Notable for the following components:      Result Value   Glucose, Bld 170 (*)    Creatinine, Ser 1.15 (*)    GFR, Estimated 59 (*)    All other components within normal limits  CBC - Abnormal; Notable for the following components:   WBC 14.4 (*)    All other components within normal limits  URINALYSIS, ROUTINE W REFLEX MICROSCOPIC - Abnormal; Notable for the following components:   Color, Urine ORANGE (*)    Nitrite POSITIVE (*)    Leukocytes,Ua MODERATE (*)    All other components within normal limits  URINALYSIS, MICROSCOPIC (REFLEX) - Abnormal; Notable for the following components:   Bacteria, UA RARE (*)    All other components within normal limits  PREGNANCY, URINE    EKG: EKG Interpretation Date/Time:  Wednesday May 03 2024 21:07:55 EST Ventricular Rate:  89 PR Interval:  140 QRS Duration:  82 QT Interval:  372 QTC Calculation: 452 R Axis:   83  Text Interpretation: Normal sinus rhythm ST & T wave abnormality, consider inferior ischemia ST & T wave abnormality, consider anterolateral ischemia Abnormal ECG No previous ECGs available Confirmed by Ruthe Cornet [phone]) on 05/04/2024 9:34:15 PM  Radiology: No results found.   Procedures   Medications Ordered in the ED  cephALEXin  (KEFLEX ) capsule 500 mg (500 mg Oral Given 05/03/24 2343)    Clinical Course as of 05/08/24 2203  Wed May 03, 2024  2351 Patient to ED after syncopal episode c/w alpha gal reactions in the past. Received epipen , is now back to baseline. Remains asymptomatic during ED encounter. EKG normal sinus. Discussed with Dr. Lenor.  No further evaluation indicated. She does have evidence of UTI which will be treated. Recommended PCP follow up. [SU]    Clinical Course User Index [SU] Odell Balls, PA-C                                 Medical Decision Making Amount and/or Complexity of Data Reviewed Labs: ordered.  Risk Prescription drug management.        Final diagnoses:  Acute cystitis without hematuria  Syncope, unspecified syncope type    ED Discharge Orders          Ordered    cephALEXin  (KEFLEX )  500 MG capsule  4 times daily        05/03/24 2340    EPINEPHrine  0.3 mg/0.3 mL IJ SOAJ injection  As needed        05/03/24 2341               Odell Balls, PA-C 05/08/24 2203  "
